# Patient Record
Sex: Male | Born: 1960 | Race: White | Hispanic: No | Smoking: Current every day smoker
Health system: Western US, Academic
[De-identification: ages and names within clinical notes are randomized; demographics above are authoritative.]

## PROBLEM LIST (undated history)

## (undated) DIAGNOSIS — E7800 Pure hypercholesterolemia, unspecified: Secondary | ICD-10-CM

## (undated) DIAGNOSIS — I10 Essential (primary) hypertension: Secondary | ICD-10-CM

## (undated) DIAGNOSIS — I6529 Occlusion and stenosis of unspecified carotid artery: Secondary | ICD-10-CM

## (undated) DIAGNOSIS — M5430 Sciatica, unspecified side: Secondary | ICD-10-CM

## (undated) DIAGNOSIS — M1990 Unspecified osteoarthritis, unspecified site: Secondary | ICD-10-CM

## (undated) DIAGNOSIS — R42 Dizziness and giddiness: Secondary | ICD-10-CM

## (undated) HISTORY — PX: OTHER PROCEDURE: U1053

## (undated) MED ORDER — NITROGLYCERIN 0.4 MG SL SUBL
0.40 mg | SUBLINGUAL_TABLET | SUBLINGUAL | Status: AC | PRN
Start: 2012-12-07 — End: ?

## (undated) MED ORDER — OXYCODONE-ACETAMINOPHEN 5-325 MG OR TABS
1.00 | ORAL_TABLET | Freq: Four times a day (QID) | ORAL | Status: AC | PRN
Start: 2012-12-30 — End: ?

---
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
## 2008-10-28 IMAGING — CT CT PELVIS W/O CM
1 of 2 series · 14 of 32 positions shown, 18 images · non-contrast
Comparison: None

CT ABDOMEN

CLINICAL DATA: Left flank pain, left lower quadrant pain,
hematuria and nausea and vomiting.

CT ABDOMEN AND PELVIS WITHOUT CONTRAST
TECHNIQUE: Multidetector CT imaging of the abdomen and pelvis was
performed following the standard
protocol without intravenous contrast.

[Series 2: 220 stone 5.0 b40f st · axial · 0.81mm/px · z∈[-356,+29]mm · 14 of 88 slices shown, 18 images]
[im 7/88  soft-tissue]
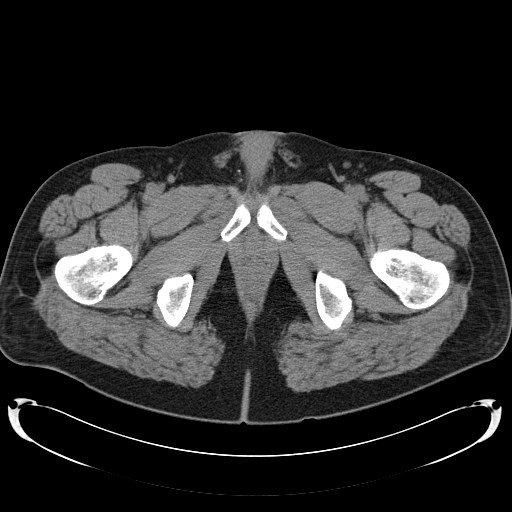
[im 7/88  bone]
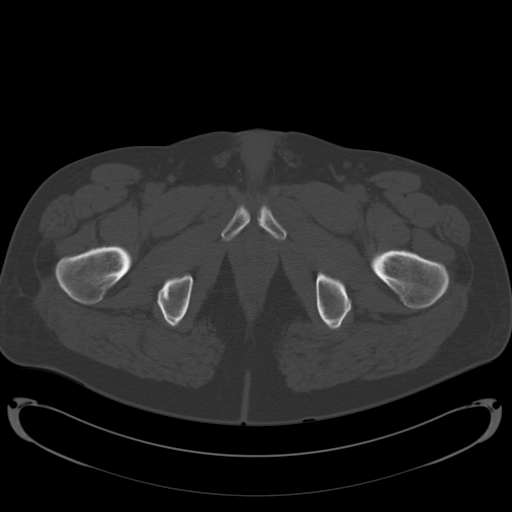
[im 13/88  soft-tissue]
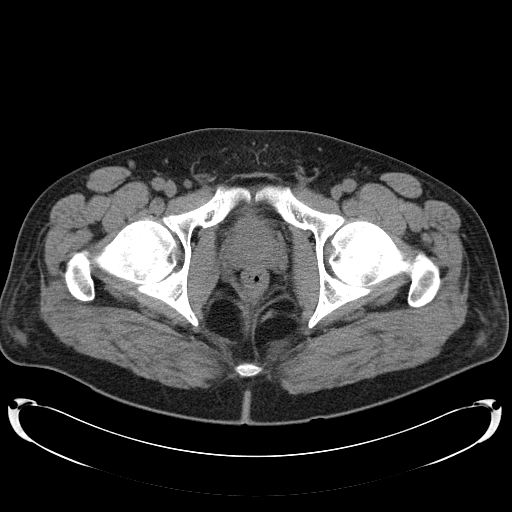
[im 19/88  soft-tissue]
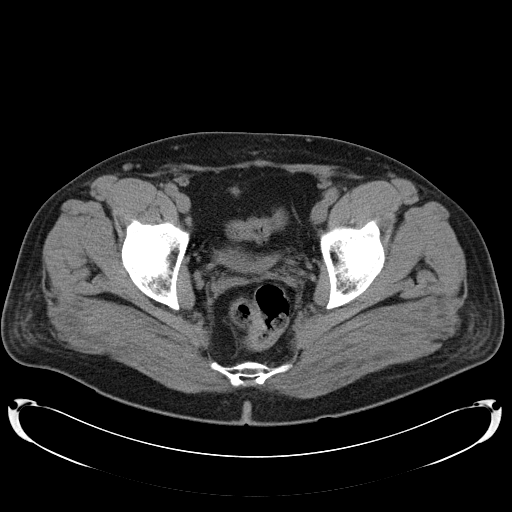
[im 25/88  soft-tissue]
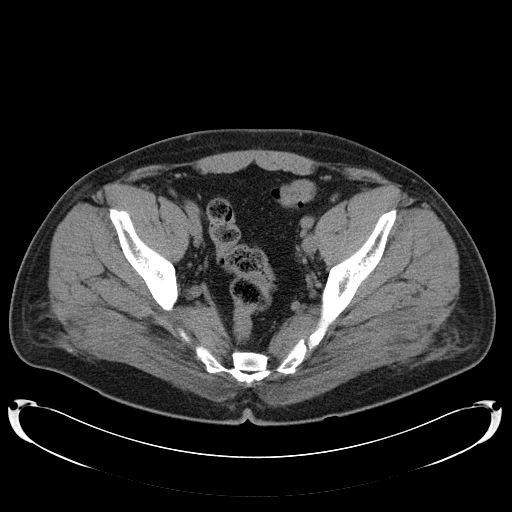
[im 35/88  soft-tissue]
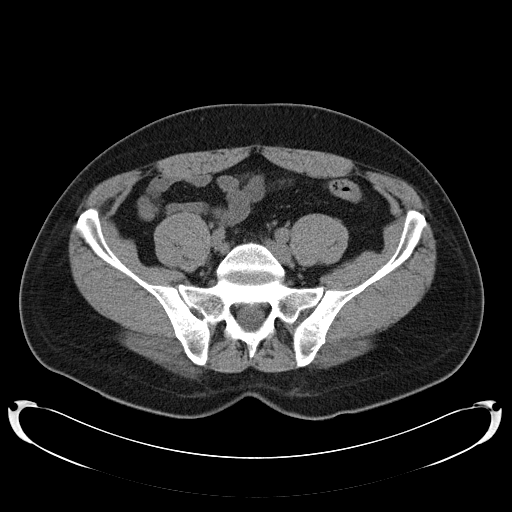
[im 41/88  soft-tissue]
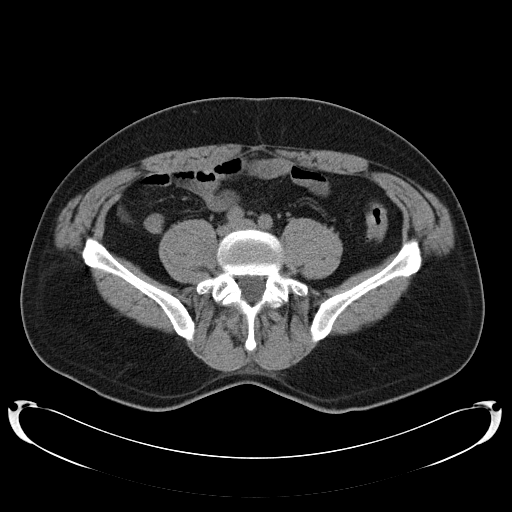
[im 47/88  soft-tissue]
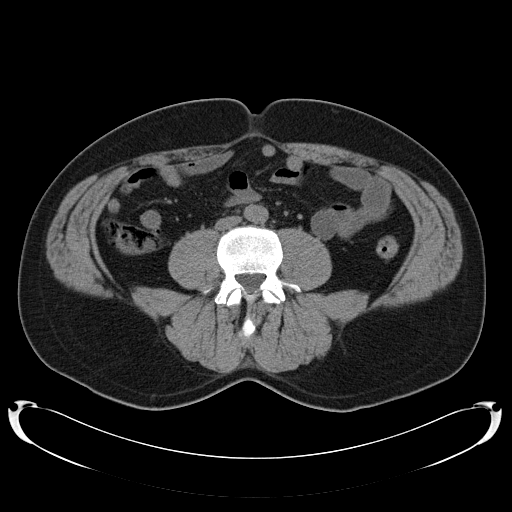
[im 53/88  soft-tissue]
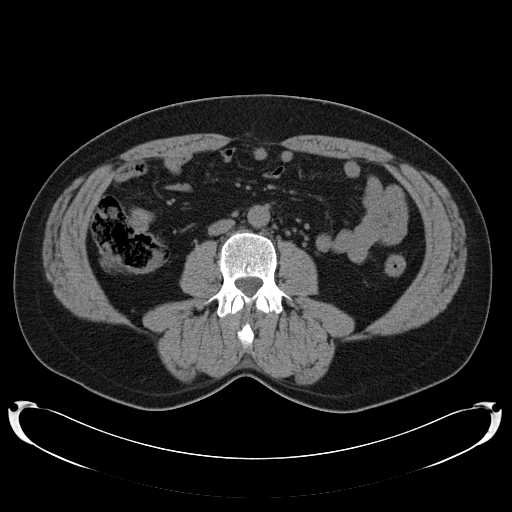
[im 63/88  soft-tissue]
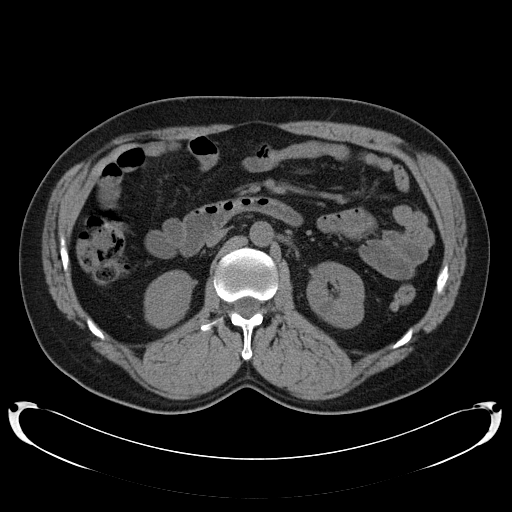
[im 63/88  bone]
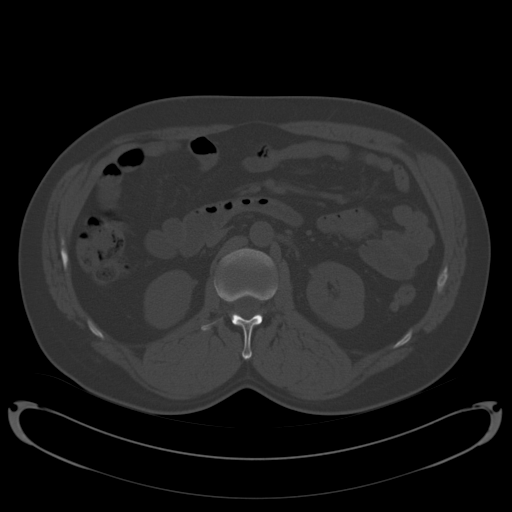
[im 69/88  soft-tissue]
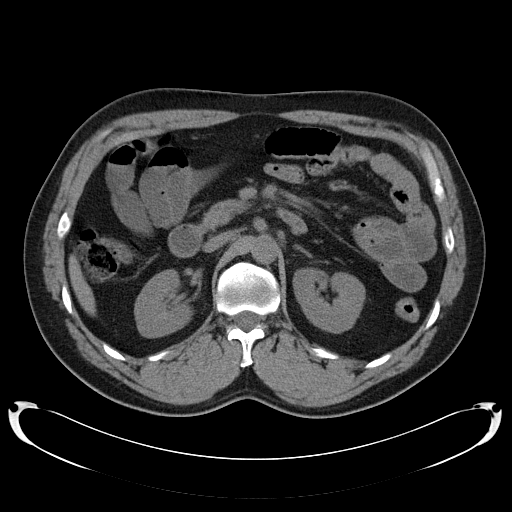
[im 75/88  soft-tissue]
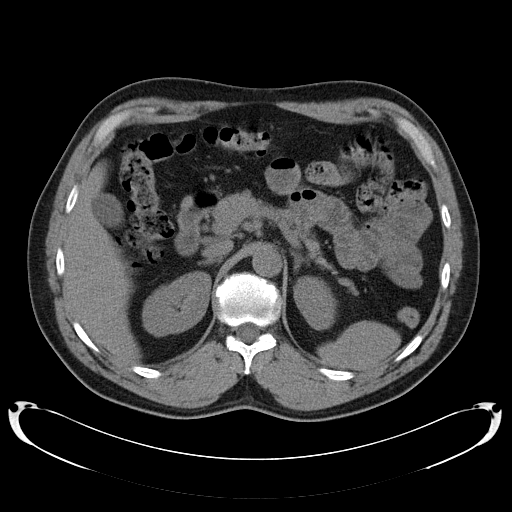
[im 75/88  lung]
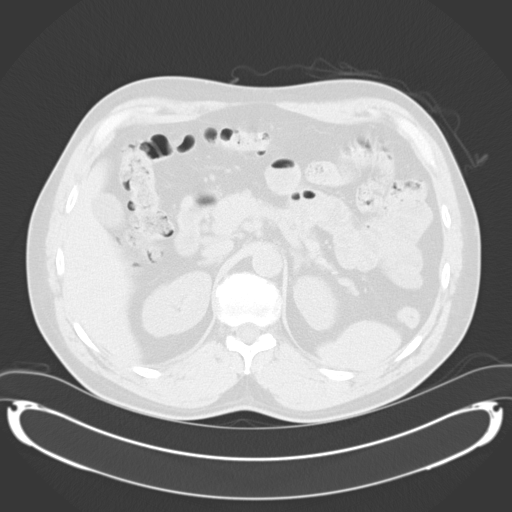
[im 78/88  lung]
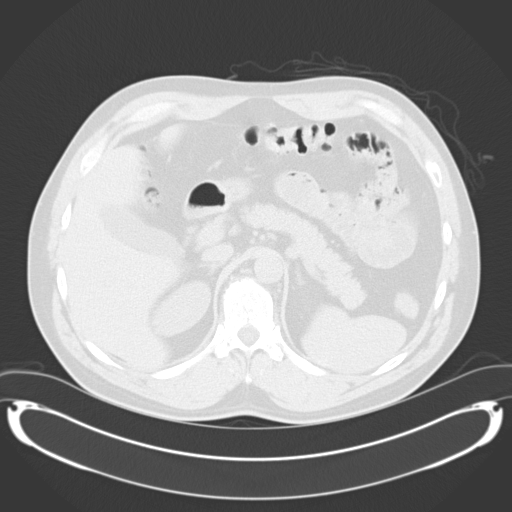
[im 81/88  soft-tissue]
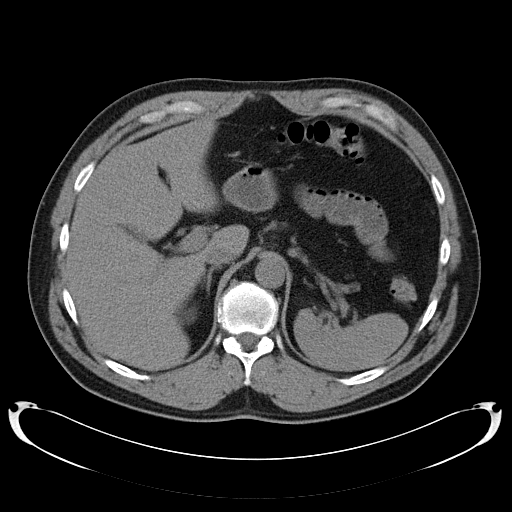
[im 81/88  lung]
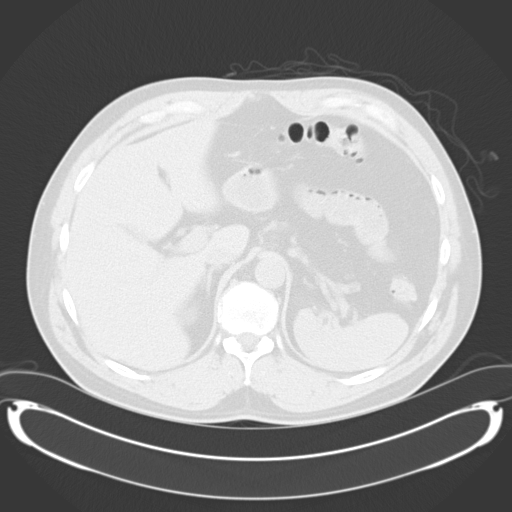
[im 84/88  lung]
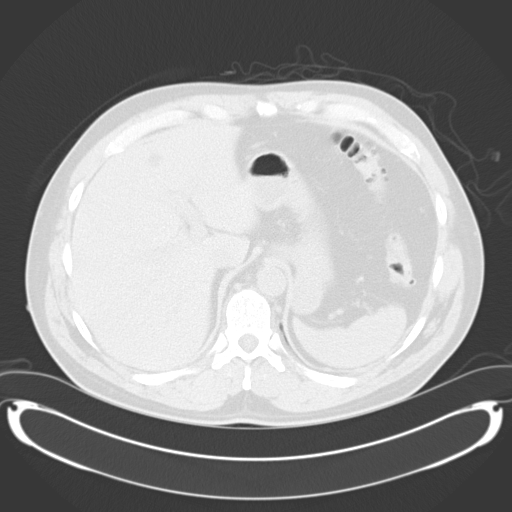

[14 of 32 positions shown; findings below may reference images not displayed]

FINDINGS: The visualized unenhanced appearance of the liver is
unremarkable.  There is a single low attenuation lesion in the
medial segment of the left hepatic lobe which measures 3.8 HU and
is consistent with benign hepatic cyst.

The spleen is normal in size.  The pancreas is unremarkable.  The
adrenal glands and kidneys demonstrate no abnormalities.
Specifically, no findings for renal calculi or renal obstructive
process.

The aorta is normal in caliber.  No mesenteric or retroperitoneal
masses or adenopathy.  The stomach, duodenum, small bowel and colon
demonstrate no significant findings.  The examination is somewhat
limited without oral contrast.

The gallbladder appears normal.  The appendix is visualized and is
normal.  There are a few small scattered colonic diverticuli.  No
significant bony findings.
IMPRESSION: 1.  No acute abdominal findings, mass lesions or adenopathy.  No
renal calculi or renal obstructive process.
2.  Simple-appearing hepatic cyst and left renal cyst.
3.  Normal appendix

CT PELVIS
FINDINGS: The rectum, sigmoid colon visualized small bowel loops
are unremarkable.  There are a few small scattered colonic
diverticuli.  No evidence for acute diverticulitis.  The bladder is
normal.  No distal ureteral or bladder calculi.  No pelvic masses
or adenopathy.  No free pelvic fluid collections.  There is a focal
area of inflammation in the mesentery adjacent to the upper sigmoid
colon.  This is most likely epiploic appendagitis.  This is usually
due to infarction of the epiploic  appendage and can be a source of
self-limiting abdominal pain..  No significant bony findings.
There are several small rounded sclerotic lesions which are likely
benign bone islands.  No inguinal masses or adenopathy.
IMPRESSION: 1.  Findings consistent with epiploic appendagitis.  Please see
above discussion.
2.  Scattered colonic diverticuli but no evidence for
diverticulitis.
3.  No masses or adenopathy.
4.  No ureteral or bladder calculi.

## 2012-12-07 ENCOUNTER — Emergency Department (HOSPITAL_COMMUNITY): Payer: Self-pay

## 2012-12-07 ENCOUNTER — Emergency Department
Admission: EM | Admit: 2012-12-07 | Discharge: 2012-12-08 | Disposition: A | Discharge status: Home / Self Care | Payer: MEDICAID | Attending: Emergency Medicine | Admitting: Emergency Medicine

## 2012-12-07 DIAGNOSIS — F17200 Nicotine dependence, unspecified, uncomplicated: Secondary | ICD-10-CM | POA: Insufficient documentation

## 2012-12-07 DIAGNOSIS — R110 Nausea: Secondary | ICD-10-CM | POA: Insufficient documentation

## 2012-12-07 DIAGNOSIS — G8929 Other chronic pain: Secondary | ICD-10-CM | POA: Insufficient documentation

## 2012-12-07 DIAGNOSIS — M545 Low back pain, unspecified: Secondary | ICD-10-CM | POA: Insufficient documentation

## 2012-12-07 DIAGNOSIS — G4700 Insomnia, unspecified: Secondary | ICD-10-CM

## 2012-12-07 DIAGNOSIS — R0789 Other chest pain: Secondary | ICD-10-CM | POA: Insufficient documentation

## 2012-12-07 DIAGNOSIS — Z5900 Homelessness unspecified: Secondary | ICD-10-CM | POA: Insufficient documentation

## 2012-12-07 MED ORDER — HYDROMORPHONE HCL 1 MG/ML IJ SOLN
1.0000 mg | Freq: Once | INTRAMUSCULAR | Status: AC
Start: 2012-12-07 — End: 2012-12-07
  Administered 2012-12-07: 1 mg via INTRAVENOUS
  Filled 2012-12-07: qty 1

## 2012-12-07 MED ORDER — NITROGLYCERIN 0.4 MG SL SUBL
0.4000 mg | SUBLINGUAL_TABLET | SUBLINGUAL | Status: DC | PRN
Start: 2012-12-07 — End: 2012-12-08
  Administered 2012-12-07: 0.4 mg via SUBLINGUAL
  Filled 2012-12-07: qty 25
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
## 2012-12-07 NOTE — ED Notes (Signed)
Report received from Carmelina Dane. RN.  First contact with patient.  Pt returned from xray.  Requesting pain medication for chest pain and back pain.  Pt on cardiac monitor. No ectopy noted on monitor NSR HR 63.

## 2012-12-07 NOTE — ED Notes (Signed)
ekg done and t/o to md xu Copy placed into chart

## 2012-12-07 NOTE — ED Notes (Signed)
Dr. Karie Chimera aware of pt's back and chest pain.  Order to medicate with NTG SL only.

## 2012-12-07 NOTE — ED Attending Note (Signed)
ED ATTENDING NOTE:    Date of encounter: 12/07/2012    Seen and examined with Dr. Karie Chimera. Please see house officer's note for full H&P. Key elements of history:    Chief complaint: Chest Pain      HISTORY: This is a 52 year old male, a [employer] patient usually, a smoker with a history of borderline HTN and hypertriglyceridemia but no diabetes, with a family history of father and mother who had MIs and stroke in their 102's, who presents with a complaint of episodes of gradual onset of dull, pressure-like "squeezing" pain located in his left chest, radiating to the left neck and head, with tingling down the left arm but nowhere else, that started yesterday as he was waling, getting more frequent and lasting longer. It has been intermittent lasting about 15 to 20 minutes at a time. The patient states it is associated with nausea, and maybe a little shortness of breath (he is not sure) but denies any associated  diaphoresis, significant shortness of breath, cough, lightheadedness, palpitations, or any other symptom.  It is unchanged with exertion, torso movements, deep breaths, leaning forward, leaning backward, food intake, and the patient denies any other exacerbating factors. It is relieved by nothing the patient can note. The pain was moderate at worst, and is now milder. The pain is reminiscent of nothing that he has had in the past. The patient specifically denies new ankle edema, calf pain, deep vein thrombosis or pulmonary embolus risk factors, but he does add that 1) he has been under some stress lately (with his living situation and with his care at the [place]), and 2) the pains seem to come on after he helped someone right their motorized wheelchair 2 days ago (but with no acute strain). Apart from the above, the ROS is otherwise unremarkable.    Key elements of exam:  Vitals noted:  ED Triage Vitals   Enc Vitals Group      Blood pressure (BP) 12/07/12 1736 128/72 mmHg      Heart Rate 12/07/12 1736 74      Respirations  12/07/12 1736 18      Temperature 12/07/12 1736 97.2 F (36.2 C)      Temp src --       SpO2 12/07/12 1736 97 %      Weight - scale 12/07/12 1736 220 lb (99.791 kg)     Subsequent vitals  BP 111/68  Pulse 61  Temp(Src) 97.3 F (36.3 C)  Resp 18  Wt 99.791 kg (220 lb)  SpO2 97%  Gen: NAD, WNWD, well-appearing, non-toxic   HEENT: face is without asymmetry; no conjunctival injection; no scleral icterus.  Neck: supple; there is no lymphadenopathy; no thyromegaly  Cardiovascular: Regular rate; regular rhythm. There are no murmurs. Can appreciate no rubs or gallops. There is no discernible JVD. Radial pulses are of equal strength bilaterally, and 2+.  Respiratory: lungs are clear to auscultation, with no tachypnea; the patient has no retractions or accessory muscle use.  Chest wall: there is no tenderness to palpation that exactly reproduces the patients presenting pain complaint, but when he extends his arms completely forward he states he feels a pulling discomfort in his chest.  Abdomen: soft; non-tender with no rebound or peritoneal signs; non-distended; no organomegaly; no masses; no hernias; no pulsatile masses.   Extremities: There is no significant edema or cyanosis.   Neuro: awake, alert, and oriented x 3, with fluent and non-dysarthric speech, following commands well; the patient is  moving all four extremities well with no gross ataxia, and there are no obvious focal neurological deficits evident.      IMPRESSION: I discussed the case presentation and plan with Dr. Karie Chimera, and agreed with the assessment and plan as discussed. With regards to the patient's chest pain, the history and physical is consistent with a differential diagnosis that includes GERD-related discomfort, chest wall pain, non-specific chest pain, or even atypical presentation of myocardial ischemia (as the patient does have a number of risk factors),with little in the clinical presentation to strongly suggest myocardial ischemia, aortic  dissection or other similar vasculopathy, pulmonary embolus, spontaneous pneumothorax, or other pathology.      MEDICAL DECISION MAKING / PLAN:   Will work up the differential diagnosis, initiate treatment as per orders, and reassess.         Pulse oximetry was checked, and was 97% on room air, which is normal.    The patient's EKG was reviewed: the rhythm was normal sinus and regular; the rate was normal at 73; the axis was normal; intervals were normal in duration; there were no acutely pathological ST-[place]ave abnormalities.      Will send a set of cardiac enzymes now and again at a time-point corresponding to 6 hours or more after the onset of symptoms. If the patient's symptoms were due to myocardial ischemia, there should be a corresponding abnormal elevation in troponin at this time point. If a set of cardiac enzymes (including a cardiac troponin) drawn at least 6 hours after the onset of symptoms reveals no abnormal elevations, this will make myocardial ischemia extremely unlikely as an etiology of the symptoms, especially in the context of the somewhat atypical presentation and overall risk profile.     Plan to get a stress ECHO from the ED assuming his enzymes are negative (Dr. Karie Chimera discussed case and risk stratification with Cardiology fellow on call, Dr. Theora Gianotti)

## 2012-12-07 NOTE — ED Notes (Signed)
Assumed care of pt from Karla,RN  Pt medicated per [facility] for pain, on cardiac monitoring, nsr 79, alarms on/audible, laying in bed low/locked, side rails upx2/call light in reach

## 2012-12-07 NOTE — ED Notes (Signed)
Pt self presents to ED with intermittent chest pain since yesterday with increase in duration today.  Also c/o back pain after lifting heavy object.  Pt on cardiac monitor no ectopy on monitor noted.  ECG, IV and Labs done by PRN nurse Carmelina Dane.

## 2012-12-07 NOTE — ED Notes (Signed)
Pt states pain not relieved by NTG.  Dr. Karie Chimera aware.  Discussing plan of care with Dr. Romona Curls.

## 2012-12-08 LAB — ECG 12-LEAD
QRS INTERVAL/DURATION: 92 ms
QRS INTERVAL/DURATION: 98 ms
VENTRICULAR RATE: 73 {beats}/min
VENTRICULAR RATE: 58 {beats}/min

## 2012-12-08 LAB — EXERCISE ECHO WITH IMAGE ENHANCEMENT AGENT IF NECESSARY

## 2012-12-08 MED ORDER — HYDROMORPHONE HCL 1 MG/ML IJ SOLN
1.0000 mg | Freq: Once | INTRAMUSCULAR | Status: AC
Start: 2012-12-08 — End: 2012-12-08
  Administered 2012-12-08: 1 mg via INTRAVENOUS
  Filled 2012-12-08: qty 1

## 2012-12-08 MED ORDER — HYDROMORPHONE HCL 1 MG/ML IJ SOLN
0.5000 mg | Freq: Once | INTRAMUSCULAR | Status: AC
Start: 2012-12-08 — End: 2012-12-08
  Administered 2012-12-08: 0.5 mg via INTRAVENOUS
  Filled 2012-12-08: qty 0.5

## 2012-12-08 MED ORDER — HALOPERIDOL 2 MG OR TABS
2.0000 mg | ORAL_TABLET | Freq: Every evening | ORAL | Status: DC
Start: 2012-12-08 — End: 2013-03-04

## 2012-12-08 MED ORDER — TRAZODONE HCL 150 MG OR TABS
150.0000 mg | ORAL_TABLET | Freq: Every evening | ORAL | Status: DC | PRN
Start: 2012-12-08 — End: 2013-03-04

## 2012-12-08 MED ORDER — OXYCODONE-ACETAMINOPHEN 5-325 MG OR TABS
1.0000 | ORAL_TABLET | Freq: Once | ORAL | Status: AC
Start: 2012-12-08 — End: 2012-12-08
  Administered 2012-12-08: 1 via ORAL
  Filled 2012-12-08: qty 1

## 2012-12-08 NOTE — ED Notes (Addendum)
Patient returned from [facility] via [place]; to new bed T1 and [place], RN informed.

## 2012-12-08 NOTE — ED Notes (Signed)
Pt still in Heart Station.

## 2012-12-08 NOTE — ED Notes (Signed)
Chart and info has been faxed to facilities per ED [facility]  Await yes or no on beds for ED placement   Pt aox3   Back pain 4/10 after percocet.  Per ED [facility].  She is to call back at 1845 to see [facility] can take pt.  Contact at [facility] is Carmela.

## 2012-12-08 NOTE — ED Notes (Signed)
Dr. Abran Duke to bedside for reevaluation of pt.

## 2012-12-08 NOTE — ED Notes (Signed)
Signout:  41M with h/o HLD, HTN, cigarette smoking presenting with L-precordial intermittent chest pain at rest radiating to arm.  EKG was normal.  Troponin negative x 1 (2nd set at 5am).  Plan for Stress ECHO in AM (Cardiology Dr. Theora Gianotti was made aware for functional study plan).    Myrtice Lauth, MD  12/08/12 [phone]

## 2012-12-08 NOTE — ED Notes (Signed)
Repeat troponin drawn and sent to lab. Pt medicated for pain. NAD. NO SOB. NSR.

## 2012-12-08 NOTE — ED Notes (Signed)
52 yr old male had stress test negative for cpain then states suicidal psych has seen is going to have dispo per case manager    Lorretta Harp, Margretta Ditty, MD  12/08/12 [phone]

## 2012-12-08 NOTE — ED Notes (Signed)
Pt moved to ER 11b. Sitter placed with pt. Pt calm/cooperative.

## 2012-12-08 NOTE — ED Notes (Signed)
Patient replaced on full monitor.

## 2012-12-08 NOTE — ED Notes (Signed)
Patient signed out to me from Dr Karie Chimera.  45M with chest pain.   Negative EKG, negative trops x 2.   Has been waiting on stress test this morning.  Received a dobutamine stress echo.  Findings were negative for ischemia.  Will DC with f/u with PMD and cardiology.      Viona Gilmore, MD  Resident  12/08/12 [phone]

## 2012-12-08 NOTE — ED Notes (Signed)
[facility] [facility] [place] [facility] with voucher, with meds in sealed bag, in nad, aao x4, rr even and unlabored, skins wdp, sts CP has resolved at this time, was given verbal and written instructions for f/u care with cardiology for Chest Pain, was explained possible worsening s/s to be aware of and was instructed to return to ed prior to appointment date if he feels his condition is not improving or is worsening, verbalized understanding and left ed ambulatory with steady gait, unassisted

## 2012-12-08 NOTE — ED Notes (Signed)
Assumed care of patient. Cardiac markers x 2 negative. Pt still c/o back pain. aaox4

## 2012-12-08 NOTE — ED Notes (Signed)
Pt now endorsing thoughts of si, requesting to speak to the md. md aware and will come speak to the patient

## 2012-12-08 NOTE — ED EKG Interpretation (Signed)
Repeat EKG 7:04am:   Sinus @ 58bpm, normal axis, PR interval , QRS duration 98ms, QTc=480ms; No ST elevations/depressions or TWI

## 2012-12-08 NOTE — ED Notes (Signed)
Chest pain for stress test in AM.    - Just going up to heart station now  - will f/u results  - will consult cardiology as needed    Viona Gilmore, MD  Resident  12/08/12 [phone]

## 2012-12-08 NOTE — ED Notes (Addendum)
Assumed care  Chest pain  ECG normal  Trop neg x 2  CXR normal  Discussed with Cards (Dr. Theora Gianotti)  Pending stress test later this morning    Glen Fowler, Glen Sjogren, MD  12/08/12 0704    Stress test negative  Stable for DC home    Glen Fowler, Glen Sjogren, MD  12/08/12 1321    Upon DC patient endorsed SI with plan  Psych consulted    Glen Fowler, Glen Sjogren, MD  12/08/12 1551

## 2012-12-08 NOTE — ED Notes (Signed)
Repeat EKG done and handed to MD Sherryll Burger

## 2012-12-08 NOTE — Consults (Signed)
Psychiatry Consult Note    Date of Admission: 12/07/2012  Consulting Attending: Sherol Dade, MD  Reason for Consult: suicide risk assessment     History of Present Illness:     Glen Fowler is a 52 year old male with a self reported history of bipolar was BIB self to ED with complaints of chest pain and arm numbness concerning for MI and expressed SI in the context of being told of discharge, recent homelessness, Utox pending.      Patient initially presented with chest pain and arm numbness now with troponins negative x2, EKG, stress echo wnl. Medically cleared.  Per ED staff, when told of impending discharge, patient expressed SI with plan to go to friends that have guns and kill himself.      Patient states that he does not feel safe by himself and that he has multiple friends with weapons that he has access to. Patient recently lost his VA benefits 4 days ago and was kicked out of his [facility] ([facility]). He has been on the streets since and rented a hotel room 2 nights ago with the last of his money. Yesterday morning he was feeling anxious, hopeless from his social situation and chest pain so he brought himself to the ED. Patient originally moved here from [facility] a few months ago as he was promised a job but he arrived too late to [place]. Since then he has been working side jobs and using up his savings. He recently got VA benefits but lost them 4 days ago. He claims that the DD214 form was "screwed up" because "they got my dates wrong".     Patient reports a history of bipolar diagnosed a few years ago. Reports manic phases consistent with staying up few days straight and going out to spend money frequently. Patient's father killed himself in 2010 which lead the patient to attempt suicide himself by jumping off a cliff resulting in BL femur fractures. Patient has been on abilify (unknown dose) and Klonopin for bipolar and trazadone 150 prn for insomnia; however, he stopped taking his medications 6 months  ago as they were too expensive and he wasn't sure if they were helping him. Patient also reports hearing voices since yesterday. He says he knows the voices are not real and that he's never heard voices before and it makes him feel unstable.     Patient is quite future oriented, specifically looking forward to obtaining VA benefits and willing to be placed in a homeless shelter despite feeling unsafe with himself. He is looking forward to follow up with outpatient psychiatry for further management and hopes to get back on medications.     Past Psychiatric History:     1) Diagnoses: bipolar per self report  2) Suicide attempts: yes, 2010 jumped off a cliff and fractured BL femurs   3) Inpatient Hospitalizations: yes, patient reports inpatient psych admission in past  4) Outpatient treatment/psychiatrist: prior treatment >6 mo ago    5) Medication Trials: zoloft, seroquel, depakote, haldol    Substance History:  denies    Medical History:   No past medical history on file.    Allergies:   Allergies   Allergen Reactions   . Aspirin Unspecified   . Compazine Unspecified   . Toradol Unspecified       Medications:   Current Facility-Administered Medications   Medication   . [COMPLETED] HYDROmorphone (DILAUDID) injection 0.5 mg   . [COMPLETED] HYDROmorphone (DILAUDID) injection 1 mg   . [  COMPLETED] HYDROmorphone (DILAUDID) injection 1 mg   . [COMPLETED] HYDROmorphone (DILAUDID) injection 1 mg   . nitroGLYcerin (NITROSTAT) SL tablet 0.4 mg     No current outpatient prescriptions on file.       Scheduled:  . [COMPLETED] HYDROmorphone  0.5 mg Once   . [COMPLETED] HYDROmorphone  1 mg Once   . [COMPLETED] HYDROmorphone  1 mg Once   . [COMPLETED] HYDROmorphone  1 mg Once       PRN:  . nitroGLYcerin  0.4 mg Q5 Min PRN       Social History:  Currently homeless as of 4 days ago.     Family History:   Father suicide  Mother "sick" in [place]                 Mental Status Exam:   Appearance: Lying in ED bed with unkempt hair and  beard, smells horrible.  Behavior: no abnormal movements  Cognition: AOx3  Speech: normal rate, tone, volume, articulation  Mood: "I feel unstable"  Affect: full   Thought Process: linear  Thought Content: Denies HI. Endorses SI without plan and is future oriented as he wishes to go to shelter and f/u outpatient. Endorses "hearing voices" that he knows are not real  Insight/Judgment: limited/limited    Pertinent Studies/Labs:   Negative troponins x2, normal EKG, stress ECHO, CBC, BMP    Assessment:    Glen Fowler is a 51 year old male with a self reported history of bipolar was BIB self to ED with complaints of chest pain and arm numbness concerning for MI and expressed SI in the context of being told of discharge, recent homelessness, Utox pending.    Patient's SI appears contingent upon housing and VA benefits.  His expression of SI in the face of impending discharge also speaks to his desire for housing.   He is quite future oriented and is looking forward to obtaining VA benfits, a shelter and psychiatric follow up, he at low risk for suicide at this time.     A comprehensive suicide risk assessment was performed and the patient was assessed to be at a low acute risk of self-harm.      Modifiable risk factors include suicidality (manifested by suicidal ideation), precipitating stressors and access to firearms or other lethal means.  Non-modifiable risk factors include previous suicide attempts, male gender, single status and family history of completed suicide.  The patient also has protective factors of future life plans.      IMPRESSION  Bipolar (self report)    Recommendations:  1.  Patient does not meet criteria for psychiatric hospitalization nor 5150 at this time. He is at low acute risk for self harm  2.  Med recs at this time include 150mg  Trazadone Qhs PRN for sleep and haldol 2mg  PO qhs for mood stabilization.  3.  Patient provided with information for clinics for outpatient followup.   4.  Patient  submitted for [facility] and case management will continue to attempt to arrange shelter housing  5  Thank you for this consult.  Please page [phone] with any questions.      Case discussed and staffed with attending, Dr. Bland Span  Note written in collaboration with Gaynelle Arabian, MS3

## 2012-12-08 NOTE — ED Notes (Addendum)
Report received from [facility], pt await stress test for chest pain. Cardiac markers neg x2. Pt resting at this time, on full monitor in NAD. 2L O2 via nc. Will cont to monitor.

## 2012-12-08 NOTE — ED Notes (Signed)
Stress Echo Interpretation:    REPORT Location: [facility] IP Accession #: [id number]  Requesting Physician: Julianne Rice MD ([id number])    Indication: 52 year old male with Chest pain.  Risk factors: age, family history, hypertension, hyperlipidemia and  tobacco use  Cardiac history: None  Medications: beta blocker    Baseline Findings  Baseline BP:114/57 HR:50 Rhythm:Sinus    Dobutamine Findings  The patient received up to 30 mcg/kg/min of dobutamine.  In addition the patient received 0.4 mg of atropine.  The maximum heart rate achieved was 143 bpm, which is 85% of predicted.  Dobutamine was stopped for target heart rate.  There was no chest pain during dobutamine infusion.    ECG Findings  Baseline ECG: Normal with no evidence of significant ST or T wave  abnormalities  Dobutamine ECG: There were no ECG changes with dobutamine infusion.    Echo Findings  Baseline Echo: No regional wall motion abnormalities are identified.  Dobutamine Echo: There was normal augmentation of all segments with  dobutamine.    Conclusions  1) Negative for ischemia.  2) Normal heart rate response to dobutamine.  3) Normal blood pressure response to dobutamine.      Signed by Christell Faith, M.D. on 12/08/2012 12:26:59 PM        Viona Gilmore, MD  Resident  12/08/12 1254

## 2012-12-08 NOTE — ED Notes (Signed)
Pt back from heart station

## 2012-12-08 NOTE — Discharge Instructions (Signed)
Chest Pain of Unclear Etiology    You have been seen for chest pain. The cause of your pain is not yet known.    Your doctor has learned about your medical history, examined you, and checked any tests that were done. Still, it is unclear why you are having pain. The doctor thinks there is only a very small chance that your pain is caused by a life-threatening condition. Later, your primary care doctor might do more tests or check you again.    Sometimes chest pain is caused by a dangerous condition, like a heart attack, aorta injury, blood clot in the lung, or collapsed lung. It is unlikely that your pain is caused by a life-threatening condition if: Your chest pain lasts only a few seconds at a time; you are not short of breath, nauseated (sick to your stomach), sweaty, or lightheaded; your pain gets worse when you twist or bend; your pain improves with exercise or hard work.    Chest pain is serious. It is VERY IMPORTANT that you follow up with your regular doctor and seek medical attention immediately here or at the nearest Emergency Department if your symptoms become worse or they change.    YOU SHOULD SEEK MEDICAL ATTENTION IMMEDIATELY, EITHER HERE OR AT THE NEAREST EMERGENCY DEPARTMENT, IF ANY OF THE FOLLOWING OCCURS:   Your pain gets worse.   Your pain makes you short of breath, nauseated, or sweaty.   Your pain gets worse when you walk, go up stairs, or exert yourself.   You feel weak, lightheaded, or faint.   It hurts to breathe.   Your leg swells.   Your symptoms get worse or you have new symptoms or concerns.        General Observation Stay (Obs)    You had an extended stay under observation.    Observation Units can be in the Emergency Department (ED). They can also be in other parts of a hospital. You may get care from the ED or other medical services. Observation units are sometimes called "Obs Units." They are also called Extended Stay Units or Clinical Decision Units (CDU). They also  have many other names. Observation care is usually for less than 24 hours.    Observation care can be a very important part of your hospital visit. While in an ED, the doctors can do tests and take care of most of your problems. This usually happens over a short stay. A longer stay is very useful for some problems.   Some medical problems take a while to treat. For example, high blood sugar may need 12-15 hours to correct. Dehydration may need a few hours of intravenous (IV) fluids. There are many other problems that may need longer care.   Some medical problems need advanced tests. An example is chest pain. With chest pain, the doctor may want to do stress testing. He or she may also want an echo (ultrasound) of your heart. A CT scan or MRI of your heart arteries could also be useful. These tests check if your chest pain is caused by a serious heart problem.   Some medical problems take time to show up. A short stay in an ED is like having a snap shot picture of your medical problem. It only shows things over a short time. An observation stay is more like a movie: Doctors can watch your medical problem over time.     Medical problems can be very complex and some conditions can be   very difficult to figure out, even with advanced medical testing. It is important to understand that even after an observation stay, reexaminations, and repeat testing, you may still have a serious medical problem.    After your observation stay, the doctor thinks it is safe for you to home. You will get more instructions about the problem you came in for.    Come to the Emergency Department again if your symptoms get worse or you think you still have a serious medical problem.

## 2012-12-08 NOTE — ED Notes (Signed)
mealtray ordered for pt

## 2012-12-08 NOTE — ED Notes (Signed)
52 yo M presented with cp originally, stress test neg  -then endorsed SI  -psych assessed, cleared  -community placement pending    Zoe Lan, MD  Resident  12/08/12 [phone]

## 2012-12-08 NOTE — ED Notes (Signed)
Pt back from heart station, pt is Aox4, requesting water. Pt c/o cp and back pain after stress test, Dr. Abran Duke notified. Report to [facility], who is assuming the care of this pt at this time. Moved to tx bed 7B

## 2012-12-08 NOTE — ED Notes (Signed)
Pt seen by Dr. Abran Duke. Pt went outside to smoke, Dr. Abran Duke okay with this. Pt will be moved to another room upon return.

## 2012-12-08 NOTE — ED Notes (Signed)
Assumed care of pt. Pt asking for pain medication. Pt alert and oriented x 4. Standing at side of bed using a urinal. NAD. VSS. NSR.

## 2012-12-08 NOTE — ED Notes (Signed)
Pt on level 2.   Sitter at bedside  Medically cleared,   Await Psych MD

## 2012-12-08 NOTE — ED Notes (Signed)
Pt requesting food, per MD Lovetro, pt ok to eat prior to midnight. Given meal tray at this time

## 2012-12-08 NOTE — ED Notes (Signed)
Pt currently in heart station for Treadmill stress echo.

## 2012-12-08 NOTE — ED Notes (Signed)
Pt sleeping in bed, NAD, awakens to verbal stimuli, denies needs at this time

## 2012-12-08 NOTE — ED Notes (Signed)
Pt. Changed into hospital gown, 2 bags of belongings taken to locker.  Sitter at bedside.

## 2012-12-15 NOTE — ED Follow-up Note (Signed)
Follow-up type: Callback       Routine ED Patient Call Back    Patient unable to be contacted, no message left

## 2012-12-16 ENCOUNTER — Emergency Department
Admission: EM | Admit: 2012-12-16 | Discharge: 2012-12-16 | Disposition: A | Discharge status: Home / Self Care | Payer: MEDICAID | Attending: Emergency Medicine | Admitting: Emergency Medicine

## 2012-12-16 ENCOUNTER — Encounter (HOSPITAL_BASED_OUTPATIENT_CLINIC_OR_DEPARTMENT_OTHER): Payer: Self-pay

## 2012-12-16 ENCOUNTER — Emergency Department (HOSPITAL_BASED_OUTPATIENT_CLINIC_OR_DEPARTMENT_OTHER): Payer: Self-pay

## 2012-12-16 DIAGNOSIS — M545 Low back pain, unspecified: Secondary | ICD-10-CM | POA: Insufficient documentation

## 2012-12-16 DIAGNOSIS — Y998 Other external cause status: Secondary | ICD-10-CM | POA: Insufficient documentation

## 2012-12-16 DIAGNOSIS — Z043 Encounter for examination and observation following other accident: Secondary | ICD-10-CM

## 2012-12-16 DIAGNOSIS — W11XXXA Fall on and from ladder, initial encounter: Secondary | ICD-10-CM

## 2012-12-16 DIAGNOSIS — M79609 Pain in unspecified limb: Secondary | ICD-10-CM | POA: Insufficient documentation

## 2012-12-16 DIAGNOSIS — S8990XA Unspecified injury of unspecified lower leg, initial encounter: Secondary | ICD-10-CM | POA: Insufficient documentation

## 2012-12-16 DIAGNOSIS — W19XXXA Unspecified fall, initial encounter: Secondary | ICD-10-CM

## 2012-12-16 DIAGNOSIS — E7800 Pure hypercholesterolemia, unspecified: Secondary | ICD-10-CM | POA: Insufficient documentation

## 2012-12-16 DIAGNOSIS — I10 Essential (primary) hypertension: Secondary | ICD-10-CM | POA: Insufficient documentation

## 2012-12-16 DIAGNOSIS — M542 Cervicalgia: Secondary | ICD-10-CM | POA: Insufficient documentation

## 2012-12-16 DIAGNOSIS — S99929A Unspecified injury of unspecified foot, initial encounter: Secondary | ICD-10-CM | POA: Insufficient documentation

## 2012-12-16 DIAGNOSIS — Y9289 Other specified places as the place of occurrence of the external cause: Secondary | ICD-10-CM | POA: Insufficient documentation

## 2012-12-16 DIAGNOSIS — S0990XA Unspecified injury of head, initial encounter: Secondary | ICD-10-CM | POA: Insufficient documentation

## 2012-12-16 DIAGNOSIS — T07XXXA Unspecified multiple injuries, initial encounter: Secondary | ICD-10-CM | POA: Insufficient documentation

## 2012-12-16 DIAGNOSIS — R071 Chest pain on breathing: Secondary | ICD-10-CM | POA: Insufficient documentation

## 2012-12-16 HISTORY — DX: Essential (primary) hypertension: I10

## 2012-12-16 HISTORY — DX: Pure hypercholesterolemia, unspecified: E78.00

## 2012-12-16 MED ORDER — HYDROMORPHONE HCL 1 MG/ML IJ SOLN
1.00 mg | Freq: Once | INTRAMUSCULAR | Status: AC
Start: 2012-12-16 — End: 2012-12-16
  Administered 2012-12-16: 1 mg via INTRAVENOUS
  Filled 2012-12-16: qty 1

## 2012-12-16 MED ORDER — IBUPROFEN 600 MG OR TABS
600.00 mg | ORAL_TABLET | Freq: Three times a day (TID) | ORAL | Status: AC | PRN
Start: 2012-12-16 — End: ?

## 2012-12-16 MED ORDER — HYDROMORPHONE HCL 1 MG/ML IJ SOLN
1.00 mg | Freq: Once | INTRAMUSCULAR | Status: AC
Start: 2012-12-16 — End: 2012-12-16

## 2012-12-16 MED ORDER — SODIUM CHLORIDE 0.9 % IV SOLN
Freq: Once | INTRAVENOUS | Status: AC
Start: 2012-12-16 — End: 2012-12-16

## 2012-12-16 MED ORDER — HYDROMORPHONE HCL 1 MG/ML IJ SOLN
INTRAMUSCULAR | Status: AC
Start: 2012-12-16 — End: 2012-12-16
  Administered 2012-12-16: 1 mg via INTRAVENOUS
  Filled 2012-12-16: qty 1

## 2012-12-16 MED ORDER — HYDROCODONE-ACETAMINOPHEN 5-325 MG OR TABS
1.0000 | ORAL_TABLET | Freq: Four times a day (QID) | ORAL | Status: DC | PRN
Start: 2012-12-16 — End: 2012-12-19

## 2012-12-16 NOTE — Discharge Instructions (Signed)
Fall, No Apparent Injury     You were seen for a fall.     There are many reasons why people fall. It is important to make sure you didn t get hurt from the fall. It is also important to make sure that you didn’t fall for another medical reason.     Sometimes people fall by simply tripping over something or slipping. This is called a “mechanical fall.” Sometimes, it s just from a simple accident. Sometimes, a person will think he/she just tripped, but the fall was really caused by a medical problem. This is true especially in the elderly. Some things that often cause falls are low blood sugar, fainting or near fainting. The reason could also be a balance problem, weakness, stroke, seizure or another serious problem. You may need more tests if the doctor thinks a more serious problem caused your fall. These could be blood tests, EKGs, x-rays, CT scans or MRIs.     You don’t seem to have any serious injuries. It is safe for you to go home today without more testing.     Follow up with your doctor.      You should make small changes to your home to keep from falling. You could add a tub mat to keep you from slipping. You can also fix loose carpeting and rugs so you won t trip.     YOU SHOULD SEEK MEDICAL ATTENTION IMMEDIATELY, EITHER HERE OR AT THE NEAREST EMERGENCY DEPARTMENT, IF ANY OF THE FOLLOWING OCCUR:  · You cannot speak clearly (slurring), one side of your face droops or you feel weak in the arms or legs (especially on one side).  · You "pass out.”  · You have severe headache, dizziness or problems with balance.  · You have severe pain.  · You cannot walk.  · You fall and hit your head.  · You have other concerns.        Fall Prevention (Edu)     You have requested information on Fall Prevention.     According to a 2003 study from the Journal of the American Geriatric[employer] Society, more than 1.8 million adults, aged 65 and older, were treated in emergency departments for fall-related injuries. More than 421,000  were hospitalized. The most common injuries from a fall are head injuries that in turn cause a brain injury, and fractures (broken bones). Of all types of broken bones that happen from falls, hip fractures are the most serious and lead to the greatest number of health problems and deaths.     To make your living area safer, older adults should consider the following:  · Improve lighting throughout the home. Use night-lights to help you see at night.  · Have handrails installed on both sides of stairways.  · Have grab bars placed next to the toilet and in the shower. Also consider an elevated toilet seat and a shower chair.  · Use non-slip bath mats in the tub or shower.  · Remove "throw rugs" to prevent tripping.  · Avoid the use of long robes to prevent tripping.  · Wear well-fitted shoes or slippers. Wearing loose footwear can cause you to shuffle, and make you more likely to trip and fall. Inexpensive anti-slip socks can also be purchased.  · Be sure to keep all electrical cords and small objects out of the pathway.  · If a cane, walker or any other assistive device is used, be sure to have them inspected regularly. The devices must be used correctly to prevent injuries.  ·   Remember to move about at a pace that is comfortable for your ability. For example, do not rush to answer the doorbell or the phone. Take your time.     In recent studies, a number of risk factors have been identified that make older adults more likely to have falls. It has also been shown that when these risk factors are modified, it will help to prevent falls.  · Exercise: Regular physical activity or exercise increases body strength and improves balance.  · Medication Review: Follow up with your doctor and pharmacist as needed to review your medications and any recent changes that may have been made. They can tell you if there are drug interactions and side-effects. If you are taking any sedatives or sleeping pills, it might be possible to  have the dosage decreased, or have the number of medications reduced. These kinds of medications can cause drowsiness and dizziness, thus posing a risk of falling.  · Vision Checks: Follow up with an eye doctor at least once a year to have your vision checked.

## 2012-12-16 NOTE — ED Notes (Signed)
Pt to xray via gurney

## 2012-12-16 NOTE — ED Notes (Addendum)
c-collar removed by dr Abran Duke.

## 2012-12-16 NOTE — ED Provider Notes (Signed)
Patient seen promptly; note entry delayed.     Glen Fowler  MRN: [id number]  DOB: August 01, 1961  Primary: No Pcp, Per Patient  Triage CC:   Chief Complaint   Patient presents with   . Trauma     fell off 10 feet ladder, now with back/hip/flank pain, no loc stated pt alert following commands      HPI: 52 year old male comes in after a fall from a ladder from >30ft or equal to. Was working on roof. Slipped, caught leg in ladder and slid down to ground. Denies LOC. No HA. + Cspine tenderness. Placed immediately in c collar at onset of my encounter with him. + midline T and L spine ttp. No abdominal pain. Full recall. Some leg pain as well. No other extremity or chest wall pain. No SOB. No CP.      Triage vitals:   ED Triage Vitals   Enc Vitals Group      Blood pressure (BP) 12/16/12 1252 109/77 mmHg      Heart Rate 12/16/12 1252 101      Respirations 12/16/12 1252 14      Temp --       Temp src --       SpO2 12/16/12 1252 98 %      Weight - scale 12/16/12 1252 223 lb (101.152 kg)      Height 12/16/12 1252 6' (1.829 m)      Head Cir --       Peak Flow --       Pain Score --       Pain Loc --       Pain Edu? --       Excl. in GC? --      Vitals record:   4    12/16/12  1252   BP: 109/77   Pulse: 101   Resp: 14   SpO2: 98%     PMH: No past medical history on file.  PSH: No past surgical history on file.  FH: No family history on file.  Allergies: Aspirin; Compazine; and Toradol              Patient's Medications   New Prescriptions    No medications on file   Previous Medications    HALOPERIDOL (HALDOL) 2 MG TABLET    Take 1 tablet by mouth at bedtime.    TRAZODONE (DESYREL) 150 MG TABLET    Take 1 tablet by mouth nightly as needed for Insomnia.   Modified Medications    No medications on file   Discontinued Medications    No medications on file     Medications   sodium chloride 0.9 % 1,000 mL IV bolus (not administered)   HYDROmorphone (DILAUDID) injection 1 mg (not administered)     Results for orders placed during the  hospital encounter of 12/07/12   BASIC METABOLIC PANEL, BLOOD       Result Value Range    Glucose 87  70 - 115 mg/dL    BUN 12  6 - 20 mg/dL    Creatinine 2.95  2.84 - 1.17 mg/dL    GFR >13      Sodium 139  136 - 145 mmol/L    Potassium 3.7  3.5 - 5.1 mmol/L    Chloride 102  98 - 107 mmol/L    Bicarbonate 25  22 - 29 mmol/L    Calcium 8.9  8.6 - 10.0 mg/dL   MAGNESIUM, BLOOD  Result Value Range    Magnesium 2.4  1.7 - 2.6 mg/dL   PHOSPHORUS, BLOOD       Result Value Range    Phosphorous 3.4  2.7 - 4.5 mg/dL   CPK-CREATINE PHOSPHOKINASE, BLOOD       Result Value Range    CPK 114  0 - 175 U/L   CKMB+INDEX (NO CPK) PANEL, BLOOD       Result Value Range    CK-MB 2.2  0.0 - 4.8 ng/mL    CK-MB Index 1.9  0.0 - 2.5 %   TROPONIN T, BLOOD       Result Value Range    Troponin T <0.01  <0.01 ng/mL   CBC WITH ADIFF, BLOOD       Result Value Range    WBC 6.3  4.0 - 10.0 1000/mm3    RBC 4.79  4.60 - 6.10 mill/mm3    Hgb 15.0  13.7 - 17.5 gm/dL    Hct 16.1  09.6 - 04.5 %    MCV 87.5  79.0 - 95.0 um3    MCH 31.3  26.0 - 32.0 pgm    MCHC 35.8  32.0 - 36.0 %    RDW 12.5  12.0 - 14.0 %    MPV 11.1  9.4 - 12.4 fL    Plt Count 205  140 - 370 1000/mm3    Segs 57  34 - 71 %    Lymphocytes 32  19 - 53 %    Monocytes 8  5 - 12 %    Eosinophils 3  1 - 7 %    Basophils 1  0 - 2 %    Absolute Neutrophil Count 3.6  1.6 - 7.0 1000/mm3    Abs Lymphs 2.0  0.8 - 3.1 1000/mm3    Abs Monos 0.5  0.2 - 0.8 1000/mm3    Abs Eosinophils 0.2  0.0 - 0.5 1000/mm3    Diff Type Automated     PROTHROMBIN TIME, BLOOD       Result Value Range    PT,Patient 11.4  9.7 - 12.5 sec    INR 1.1     APTT, BLOOD       Result Value Range    PTT 31.7  25.0 - 34.0 sec   CKMB+INDEX (NO CPK) PANEL, BLOOD       Result Value Range    CK-MB 1.8  0.0 - 4.8 ng/mL    CK-MB Index 1.9  0.0 - 2.5 %   TROPONIN T, BLOOD       Result Value Range    Troponin T <0.01  <0.01 ng/mL   CPK-CREATINE PHOSPHOKINASE, BLOOD       Result Value Range    CPK 96  0 - 175 U/L   ECG 12-LEAD        Result Value Range    VENTRICULAR RATE 73      ATRIAL RATE 73      PR INTERVAL 146      QRS INTERVAL/DURATION 92      QT 380      QTC INTERVAL 418      P AXIS 56      R AXIS 4      T AXIS 41      ECG INTERPRETATION        Value: Normal sinus rhythm      Normal ECG            Confirmed by Above generated  by computer only, Results in ED notes (204),       editor HADNOT, ANTHONY ([id number]) on 12/08/2012 9:06:35 AM   ECG 12-LEAD       Result Value Range    VENTRICULAR RATE 58      ATRIAL RATE 58      PR INTERVAL 154      QRS INTERVAL/DURATION 98      QT 440      QTC INTERVAL 431      P AXIS 67      R AXIS 21      T AXIS 57      ECG INTERPRETATION        Value: Sinus bradycardia      Otherwise normal ECG            Confirmed by Above generated by computer only, Results in ED notes (204),       editor HADNOT, ANTHONY ([id number]) on 12/08/2012 9:06:40 AM   TREADMILL STRESS ECHO       Result Value Range    REPORT        Value: Location: [facility]     Accession #:  [id number]      Requesting Physician: Julianne Rice MD ([id number])            Indication: 51 year old male with Chest pain.      Risk factors:  age, family history, hypertension, hyperlipidemia and      tobacco use      Cardiac history: None      Medications: beta blocker            Baseline Findings      Baseline BP:114/57   HR:50  Rhythm:Sinus            Dobutamine Findings      The patient received up to 30 mcg/kg/min of dobutamine.      In addition the patient received 0.4 mg of atropine.      The maximum heart rate achieved was 143 bpm, which is 85% of predicted.      Dobutamine was stopped for target heart rate.      There was no chest pain during dobutamine infusion.            ECG Findings      Baseline ECG: Normal with no evidence of significant ST or T wave      abnormalities      Dobutamine ECG: There were no ECG changes with dobutamine infusion.            Echo Findings      Baseline Echo: No regional wall motion abnormalities are identified.      Dobutamine Echo: There  was normal augmentation of all segments with      dobutamine.            Conclusions      1) Negative for ischemia.      2) Normal heart rate response to dobutamine.      3) Normal blood pressure response to dobutamine.                  Signed by Christell Faith, M.D. on 12/08/2012 12:26:59 PM      Electronic signature derived from a controlled access password.     No results found for this visit on 12/16/12.  X-ray Chest Frontal And Lateral    12/07/2012   EXAM DESCRIPTION: CHEST 2 VIEWS FRONTAL & LATERAL  CLINICAL HISTORY: Chest pain.  COMPARISON: None available.  FINDINGS: No pleural effusion or pneumothorax demonstrated.  No consolidation.  Unremarkable cardiomediastinal silhouette.  No acute osseous abnormality identified.  IMPRESSION: Unremarkable two-view chest.  CONCURRENT SUPERVISION: I have reviewed the images and agree with the Fellow's interpretation.     REVIEW OF SYSTEMS: 12 point ROS performed with patient, with pertinent positives and negatives as per HPI.  Constitutional: no fever, chills or weight loss   Eyes: no vision changes   HENT: no headaches, nose bleeds or oral lesions   Cardiovascular: no chest pain or peripheral edema   Respiratory: no SOB, dyspnea or hemoptysis   Gastrointestinal: no nausea, vomiting, or hematemesis. No abdominal distention   Genitourinary: no dysuria   Musculoskeletal: Neck pain, back pain, leg pain  Integumentary: no rashes or jaundice   Neurological: no tremors or difficulties with memory   Psychiatric: Negative   Endocrine: no cold/heat intolerance, no excessive sweating or polyuria   Hematologic/Lymphatic: no easy bruising or bleeding   Allergic/Immunologic: negative, see allergy list  All other systems were reviewed and are negative in ROS - except as noted above and/or presented in HPI.    PHYSICAL EXAM:  Gen: NAD, AOx3. Appears well-developed and well-nourished. Denies HA, or photophobia, or neck stiffness.  + cervical and thoracic and lumbar pain.   HEENT: Throat: OP  NL, no exudates, Dentition good. No lesions seen in proximal OP or on uvula. No uvular deviation.   Nose: normal, no dried blood, no septal hematoma, not TTP.   EAC L/R NL, TM L NL, TM R NL.   No facial dysjunction. No infraorbital anesthesia or trismus. No stepoffs or malocclusion.   No peritonsillar swelling or uvular deviation. No tongue fasciculations.  Eyes: PERRL OU, 4--2, 4--2, No APD OU. EOMI OU.   No greater than physiologic horizontal nystagmus OU. No vertical or rotatory   component OU. No subjective diplopia. Non-icteric. CVF grossly full OU.   CV: Rate: NL. No Rubs, Murmurs or Gallups. Cap refill <2 sec. No JVD. Strong distal pulses and symmetric, UE and LE BL. Patient with no difference in sensation sitting forward or backward.   Resp: CTABL, Symmetric BS. No stridor. No Rales. No Rhonchi. No Wheezing.  Abd: NT/ND, No G/R, no masses. No SP tenderness. No CVA tenderness either side. Normal bowel sounds. No borborygmi.   MSK: Pos straight leg raise R leg. TTP BL LE, knees, tibfib.   Skin: No Rash,  Ecchy,  Lesions,  Wounds,  Cellulitis, or Abscess. No jaundice.  Neuro: CN 2-12 Grossly intact. MAEx4.   No dysdiadokinesia, NL finger-nose, No dysmetria, No pastpointing. No pronator drift. Gait Normal, Romberg Neg, Babinski Downgoing. No asterixis. Sensation intact to LP/DP/PP RUE, RLE, LUE, LLE - and symmetric. Speech is fluid and coherent. No dysphonia, or hypophonia, or apparent aphasia - receptive or expressive. Correct object naming. EOMI OU and pursuit fluid OU, non-saccadic. See EYES for remainder of exam.  Psych: Appropriate    RESULTS:   O2 sat interpretation: Normal levels on RA; Normal.    Labs: as resulted.  Monitor: No ectopy seen unless otherwise noted. Normal.  Radiology: as resulted.    ASSESSMENT/PLAN    52 year old male with complaint of Trauma    1. Fall  - patient with fall > or equal to 10 ft  - medium risk as per [place] rules  - with neck pain  - with some t and L midline  - i placed  in collar at onset of my  exam  - will ct head as well, as above  - no LOC  - CT cspine  - will run T and L as well  - positive R leg raise  - labs as ordered  - no abdominal pain  - remainder of xrays as ordered    Case was discussed with the attending of record, who concurs with the assessment and plan unless otherwise noted in his/her note. Tori Milks, MD - R3 / PGY5  Resident, Emergency Medicine          Viona Gilmore, MD  Resident  12/16/12 [phone]

## 2012-12-16 NOTE — ED Attending Note (Signed)
ED ATTENDING NOTE:    Patient interviewed and examined by me. Case was discussed with resident, Dr.  Abran Duke.    Chief Complaint   Patient presents with   . Trauma     fell off 10 feet ladder, now with back/hip/flank pain, no loc stated pt alert following commands        Case summary is as follows: The Patient is a 52 year old year old male who presents with Trauma  . The history is obtained from the patient. He reports having an a fall off attempting to climb down as the ladder from his roof. The patient reports he was on his way down to the ladder for his left foot went inside the ladder and then he fell approximately 6 feet and the opposite way to the ground. Were 1 foot was on one side of the ladder and the rest of his body was on the other side ladder. He states the foot that was still on the outside landed straight on the ground causing some heel pain. He then hit his head. He denies any loss of consciousness or headache. He does report having some cervical spinal tenderness. He also states he has a midline upper and lower back pain in addition to right knee pain.      Pain Assessment: The patient reports their pain as 6/10 at maximum and currently has 4/10.     Past Medical History   Diagnosis Date   . Hypertension    . Pure hypercholesterolemia      Past Surgical History   Procedure Laterality Date   . No past surgical history       History     Social History   . Marital Status: Single     Spouse Name: N/A     Number of Children: N/A   . Years of Education: N/A     Occupational History   . Not on file.     Social History Main Topics   . Smoking status: Current Every Day Smoker   . Smokeless tobacco: Not on file   . Alcohol Use: Not on file   . Drug Use: Not on file   . Sexually Active: Not on file     Other Topics Concern   . Not on file     Social History Narrative   . No narrative on file     Family History   Problem Relation Age of Onset   . Hypertension Mother    . Heart Mother    . Stroke Mother    .  Hypertension Father    . Heart Father    . Stroke Father      Medication Review Moment  Prior to Admission Medications   Outpatient Medications Last Dose Informant Patient Reported? Taking?   haloperidol (HALDOL) 2 MG tablet   No No   Sig: Take 1 tablet by mouth at bedtime.   traZODone (DESYREL) 150 MG tablet   No No   Sig: Take 1 tablet by mouth nightly as needed for Insomnia.      Facility-Administered Medications: None       Review of Systems - Constitutional: negative, fatigue, malaise, fever.  Eyes: negative, blurry vision, double vision.  Ears, Nose, Mouth, Throat: negative, sore throat, nasal congestion, ear pain.  CV: negative, chest pain.  Resp: negative, shortness of breath.  GI: negative, vomiting, nausea, abdominal pain.  GU: negative, dysuria, hematuria.  Musculoskeletal: joint pain, back pain, muscle pain,  neck pain.  Integumentary: bruising.  Neuro: negative, numbness or tingling.    Physical Examination:   4    12/16/12  1252   BP: 109/77   Pulse: 101   Resp: 14   SpO2: 98%     Vital Signs noted from Triage Page.  Adriaan Maltese is a well developed well nourished male in no apparent distress. He is alert and oriented *3.   HEENT exam shows: Ears TM clear bilaterally. Eyes PERRL EOMI. Mouth poor oral hygiene. Head atraumatic.  Neck exam is non-tender.   Chest exam is clear to auscultation bilaterally. Mild discomfort with chest wall palpation.  Cardiac exam shows regular rate and rhythm. No murmurs auscultated.   Abdominal exam has bowel sounds present and soft. The abdomen is non-tender. No masses or rebound can be palpated.  Back exam has no costovertebral angle tenderness. diffuse spinal tenderness to palpation.   Extremity exam is significant for 2/2 pulses. Tenderness to palpation at the right knee and ankle. No obvious ecchymosis or bruising.    Diagnostic Studies:  Pulse oximetry on room air is 98%. Oxygen Saturation interpretation is normal on Room Air.  Lab Result:    Results for orders placed  during the hospital encounter of 12/16/12   CBC WITH ADIFF, BLOOD       Result Value Range    WBC 6.9  4.0 - 10.0 1000/mm3    RBC 5.14  4.60 - 6.10 mill/mm3    Hgb 15.7  13.7 - 17.5 gm/dL    Hct 16.1  09.6 - 04.5 %    MCV 87.4  79.0 - 95.0 um3    MCH 30.5  26.0 - 32.0 pgm    MCHC 35.0  32.0 - 36.0 %    RDW 12.4  12.0 - 14.0 %    MPV 10.7  9.4 - 12.4 fL    Plt Count 206  140 - 370 1000/mm3    Segs 69  34 - 71 %    Lymphocytes 23  19 - 53 %    Monocytes 6  5 - 12 %    Eosinophils 1  1 - 7 %    Absolute Neutrophil Count 4.7  1.6 - 7.0 1000/mm3    Abs Lymphs 1.6  0.8 - 3.1 1000/mm3    Abs Monos 0.4  0.2 - 0.8 1000/mm3    Abs Eosinophils 0.1  0.0 - 0.5 1000/mm3    Diff Type Automated     COMPREHENSIVE METABOLIC PANEL, BLOOD       Result Value Range    Glucose 90  70 - 115 mg/dL    BUN 16  6 - 20 mg/dL    Creatinine 4.09  8.11 - 1.17 mg/dL    GFR >91      Sodium 136  136 - 145 mmol/L    Potassium 4.2  3.5 - 5.1 mmol/L    Chloride 104  98 - 107 mmol/L    Bicarbonate 20 (*) 22 - 29 mmol/L    Calcium 9.2  8.6 - 10.0 mg/dL    Total Protein 7.5  6.0 - 8.0 g/dL    Albumin 4.4  3.5 - 5.2 g/dL    Bilirubin, Tot 0.3  <1.2 mg/dL    AST (SGOT) 27  0 - 40 U/L    ALT (SGPT) 29  0 - 41 U/L    Alkaline Phos 69  40 - 129 U/L   MAGNESIUM, BLOOD       Result  Value Range    Magnesium 1.9  1.7 - 2.6 mg/dL   APTT, BLOOD       Result Value Range    PTT 31.8  25.0 - 34.0 sec   PROTHROMBIN TIME, BLOOD       Result Value Range    PT,Patient 10.3  9.7 - 12.5 sec    INR 1.0      CBC, chemistries, liver function tests and coagulation studies are normal.  Radiology Results:   CT HEAD W/O CONTRAST (Preliminary result)  Result time: 12/16/12 16:30:27      Preliminary result by Electronic Interface To Epic, Ancillary System (12/16/12 16:30:27)      Narrative:      Francia Greaves DESCRIPTION:  CT HEAD/BRAIN W/O CONTRAST on 12/16/2012 at 1539 hours    CLINICAL HISTORY:  Fall, 10 foot from ladder.    TECHNIQUE:  A CT scan of the head was performed with 2.5 mm  contiguous axial slices from   the foramen magnum to the skull vertex without the use of intravenous   contrast.    RADIATION DOSE FOR ALL CONCURRENTLY PERFORMED EXAMS:    SERIES CTDIvol(mGy) DLP(mGy-cm)    2 58.69 958.24    2 20.10 464.08    COMPARISON:  None available.    FINDINGS:  The ventricles and sulci are normal for age. No mass-effect or midline shift   is present, and the basal cisterns are patent. There is no evidence of   intracranial hemorrhage or mass lesion.    The soft tissues of the scalp are normal. The calvarium and skull base are   normal without evidence of fracture or other abnormality. Mild mucosal   thickening of the right maxillary sinus. A small mucous retention cyst lies   within the left maxillary sinus. Otherwise, the remaining visualized paranasal  sinuses and mastoid air cells are clear.    IMPRESSION:  No evidence of acute intracranial hemorrhage, mass effect, midline shift or   fracture.    CONCURRENT SUPERVISION:  I have reviewed the images and agree with the Resident's interpretation.    DOSE STATEMENT:  [facility] CT scanners employ modern techniques for CT dose   reduction, including protocol review, automatic exposure control, and   iterative reconstruction techniques. These features assure that radiation   dose levels in CT are optimized and are consistent with state-of-the-art, low   dose CT practice.                  CT C-SPINE W/O CONTRAST (Final result)  Result time: 12/16/12 16:22:52      Final result by Electronic Interface To Epic, Ancillary System (12/16/12 16:22:52)      Narrative:      Francia Greaves DESCRIPTION:  CT CERVICAL W/O CONTRAST    CLINICAL HISTORY:  Trauma. Fall from 10 foot ladder.    TECHNIQUE:  Helical CT was obtained from the skull base through the upper chest without   contrast and reconstructed in axial, sagittal and coronal planes using soft   tissue and bone window algorithms.    CTDI (mGy)= 58.69. DLP(mGy-cm)= 858.24.    CTDI (mGy)= 20.10.  DLP(mGy-cm)= 464.08.    COMPARISON:  CT of the thoracic spine, same day.    FINDINGS:  There is no evidence for acute fracture or subluxation. The vertebral body   heights are maintained. There is normal bone mineralization.    C1-C2: Normal C1-C2 relationship. Moderate degenerative changes at the median   atlantoaxial articulation,  with adjacent ligamentous ossification superior and  inferior to the anterior arch of C1. No significant central canal narrowing.    C2-C3: Disc height preserved. No significant spinal canal or neural foraminal   narrowing.    C3-C4: Mild disc height loss. Associated uncinate process hypertrophy. Small   focal disc protrusion. Mild bilateral neural foraminal narrowing. Mild central  canal narrowing.    C4-C5: Moderate loss of intervertebral disc height. Associated uncinate   process hypertrophy and posterior disc osteophyte complex. 2 mm of   degenerative retrolisthesis of C4 on C5. Moderate to severe bilateral neural   foraminal narrowing. Mild central canal narrowing.    C5-C6: Mild to moderate loss of intervertebral disc height. Associated   uncinate process hypertrophy. Moderate to severe right neural foraminal   narrowing. Mild central canal narrowing.    C6-C7: Disc height preserved. No significant spinal canal or neural foraminal   narrowing.    C7-T1: Disc height preserved. No significant spinal canal or neural foraminal   narrowing.    There is no prevertebral soft tissue swelling. Visualized portions of the lung  apices are normal. Scattered atherosclerotic calcifications are noted.    IMPRESSION:  No acute fracture or malalignment of the cervical spine.    Mild to moderate degenerative disc disease of the mid cervical spine, with   associated central canal and neural foraminal narrowing, as described. Minimal  degenerative retrolisthesis of C4 on C5.                  CT L-SPINE W/O IV CONTRAST (Final result)  Result time: 12/16/12 16:22:17      Final result by Electronic  Interface To Epic, Ancillary System (12/16/12 16:22:17)      Narrative:        INDICATION: Trauma.    COMPARISON: None.    TECHNIQUE: Helical CT was obtained through the lumbar spine without contrast   and reconstructed in axial, sagittal and coronal planes using soft tissue and   bone window algorithms.    CTDI (mGy)= 29.46. DLP(mGy-cm)= 735.63.    FINDINGS:    There is no evidence for acute fracture or subluxation. Chronic bilateral L5   pars defects are noted, without spondylolisthesis. The vertebral body heights   and alignments are maintained. There is normal bone mineralization.    The disc heights are preserved. There is no significant central canal or   neural foraminal narrowing.    There are mild degenerative changes of both sacroiliac joints.    There is no paravertebral soft tissue swelling. There is minimal   atherosclerotic calcification of the abdominal aorta.    IMPRESSION:    No acute fracture or malalignment of the lumbar spine.    Chronic L5 pars defects without spondylolisthesis.                  CT T-SPINE W/O IV CONTRAST (Final result)  Result time: 12/16/12 16:21:45      Final result by Electronic Interface To Epic, Ancillary System (12/16/12 16:21:45)      Narrative:        INDICATION: Fall from height .    COMPARISON: None.    TECHNIQUE: Helical CT was obtained through the thoracic spine without contrast  and reconstructed in axial, sagittal and coronal planes using soft tissue and  bone window algorithms.    CTDI (mGy)= 30.17. DLP(mGy-cm)= 1098.22.    FINDINGS:    There are 12 rib-bearing thoracic vertebral segments.    There is no  evidence for acute fracture or subluxation. The vertebral body   heights and alignments are maintained. There is mild degenerative disc disease  of the mid to lower thoracic spine with associated disc height loss, endplate  osteophytes, and Schmorl's nodes. There is normal bone mineralization.    There is no significant spinal canal or neural foraminal  narrowing.    There is no paravertebral soft tissue swelling.    Visualized portions of the lungs demonstrate dependent atelectasis.    IMPRESSION:    No acute fracture or malalignment of the thoracic spine.    Mild degenerative disc disease of the mid to lower thoracic spine.                  X-RAY CHEST FRONTAL AND LATERAL (Final result)  Result time: 12/16/12 16:21:10      Final result by Electronic Interface To Epic, Ancillary System (12/16/12 16:21:10)      Narrative:      Francia Greaves DESCRIPTION:  CHEST 2 VIEWS FRONTAL & LATERAL    CLINICAL HISTORY:  Fall from ladder    COMPARISON:  12/07/2012    FINDINGS:  Moderately expanded lungs.    No pleural effusion or pneumothorax. No consolidation.    Unremarkable cardiomediastinal silhouette.    No acute osseous abnormality.    IMPRESSION:  No acute chest injury.    CONCURRENT SUPERVISION:  I have reviewed the images and agree with the resident interpretation.                  X-RAY CALCANEUS MINIMUM 2 VIEWS - LEFT (Final result)  Result time: 12/16/12 16:03:04      Final result by Electronic Interface To Epic, Ancillary System (12/16/12 16:03:04)      Narrative:        INDICATION: Fall.    COMPARISON: None.    TECHNIQUE: 2 image(s) of the left calcaneus.    FINDINGS: No acute fracture, malalignment or joint effusion is seen.   Enthesopathy is seen at the Achilles insertion on the calcaneus. Soft tissues   are unremarkable.    IMPRESSION:    No acute osseous abnormality.                  X-RAY CALCANEUS MINIMUM 2 VIEWS - RIGHT (Final result)  Result time: 12/16/12 16:03:04      Final result by Electronic Interface To Epic, Ancillary System (12/16/12 16:03:04)      Narrative:        INDICATION: Fall.    COMPARISON: None.    TECHNIQUE: 2 image(s) of the right calcaneus.    FINDINGS: No acute fracture, malalignment or joint effusion is seen.   Enthesopathy is seen at the Achilles insertion on the calcaneus. An os   peroneum is noted. Soft tissues are  unremarkable.    IMPRESSION:    No acute osseous abnormality.                  X-RAY TIBIA & FIBULA 2 VIEWS - LEFT (Final result)  Result time: 12/16/12 16:01:27      Final result by Electronic Interface To Epic, Ancillary System (12/16/12 16:01:27)      Narrative:        INDICATION: Fall from height .    COMPARISON: Left knee and calcaneus radiographs, same day.    TECHNIQUE: Four image(s) of the left tibia/fibula.    FINDINGS:    There is no evidence for fracture or dislocation. A corticated ossicle seen at  the medial malleolar tip, likely reflecting sequela of a remote avulsion   injury. There is no overlying soft tissue swelling. A round soft tissue   calcification is noted within the medial soft tissues at the mid tibia level   measuring approximately 6 mm, likely a phlebolith. The visualized joint spaces  are congruent.    As seen on left knee radiographs a linear density projects over the patellar   tendon on the lateral view .    IMPRESSION:    No acute osseous abnormality.    Linear density projecting over the patellar tendon on the lateral view.   Recommend correlation for penetrating trauma and foreign body.                  X-RAY TIBIA & FIBULA 2 VIEWS - RIGHT (Final result)  Result time: 12/16/12 15:59:53      Final result by Electronic Interface To Epic, Ancillary System (12/16/12 15:59:53)      Narrative:        INDICATION: Fall from height .    COMPARISON: Right knee and calcaneus radiographs, same day.    TECHNIQUE: Four image(s) of the right tibia/fibula.    FINDINGS:    There is no evidence for fracture or dislocation. The soft tissues are   normal. The visualized joint spaces are normal.    IMPRESSION:    No acute osseous abnormality.                  X-RAY KNEE 3 VIEWS - RIGHT (Final result)  Result time: 12/16/12 15:56:56      Final result by Electronic Interface To Epic, Ancillary System (12/16/12 15:56:56)      Narrative:        HISTORY: Fall from 10 feet.    COMPARISON STUDIES:  None.    TECHNIQUE: 3 image(s) of the right knee.    FINDINGS: There is no acute fracture, malalignment, or joint effusion. The   joint spaces are preserved. Small medial compartment marginal osteophytes are   noted. There is mild nonspecific prepatellar soft tissue edema.    IMPRESSION:    No acute osseous abnormalities.                  X-RAY KNEE 3 VIEWS - LEFT (Final result)  Result time: 12/16/12 15:55:29      Final result by Electronic Interface To Epic, Ancillary System (12/16/12 15:55:29)      Narrative:        HISTORY: Fall from 10 feet.    COMPARISON STUDIES: None.    TECHNIQUE: 3 image(s) of the left knee.    FINDINGS: There is no acute fracture, malalignment, or joint effusion. The   joint spaces are preserved. An approximately 12 mm in length linear density   projects over the patellar tendon on the lateral view, possibly dystrophic   calcification versus a foreign body. The soft tissues are otherwise normal.    IMPRESSION:    No acute osseous abnormalities.    Dystrophic calcification versus foreign body overlying the patellar tendon on   the lateral view. Please correlate with any signs of penetrating trauma to the  anterior knee.     Impression: The patient is a 52 year old male who presents after a fall off a ladder from a roof with multiple injury complaints including head, neck, back and lower extremities. The concern is for blunt head trauma 4 subdural hematoma or intracranial hemorrhage, as well as a cervical spinal injury such  fracture. His other chest wall complaints and extremity complaints concerning for rib fractures or other extremity fractures.    Differential Diagnosis: blunt head trauma, subdural hematoma or intracranial hemorrhage, as well as a cervical spinal injury such fracture, rib fractures or other extremity fractures.    Medical Decision Plan: Emergency room plan is to obtain x-rays of the extremities as well as her head and cervical spine as well as a thoracic and lumbar spine  CT looking for acute blunt trauma. Also bruised plan to obtain laboratory studies looking for anemia or other sources of blood loss. I I agree with the resident's history and physical exam presented. The resident has developed an appropriate plan and management for the patient.

## 2012-12-16 NOTE — ED Notes (Signed)
Pt back from ct and xray, pt c/o mod back pain after being moved for scans.

## 2012-12-16 NOTE — ED Notes (Signed)
Dr Abran Duke at bedside for eval, placed in hard collar +mae, alert following commands

## 2012-12-19 ENCOUNTER — Emergency Department
Admission: EM | Admit: 2012-12-19 | Discharge: 2012-12-20 | Disposition: A | Discharge status: Home / Self Care | Payer: MEDICAID | Attending: Emergency Medicine | Admitting: Emergency Medicine

## 2012-12-19 DIAGNOSIS — Z87442 Personal history of urinary calculi: Secondary | ICD-10-CM | POA: Insufficient documentation

## 2012-12-19 DIAGNOSIS — N281 Cyst of kidney, acquired: Secondary | ICD-10-CM

## 2012-12-19 DIAGNOSIS — F17200 Nicotine dependence, unspecified, uncomplicated: Secondary | ICD-10-CM | POA: Insufficient documentation

## 2012-12-19 DIAGNOSIS — R109 Unspecified abdominal pain: Secondary | ICD-10-CM | POA: Insufficient documentation

## 2012-12-19 DIAGNOSIS — E7800 Pure hypercholesterolemia, unspecified: Secondary | ICD-10-CM | POA: Insufficient documentation

## 2012-12-19 DIAGNOSIS — I10 Essential (primary) hypertension: Secondary | ICD-10-CM | POA: Insufficient documentation

## 2012-12-19 DIAGNOSIS — R319 Hematuria, unspecified: Secondary | ICD-10-CM | POA: Insufficient documentation

## 2012-12-19 DIAGNOSIS — N23 Unspecified renal colic: Secondary | ICD-10-CM

## 2012-12-19 LAB — URINALYSIS
Nitrite: NEGATIVE
pH: 6.5 (ref 5.0–8.0)

## 2012-12-19 MED ORDER — HYDROMORPHONE HCL 1 MG/ML IJ SOLN
0.50 mg | Freq: Once | INTRAMUSCULAR | Status: AC
Start: 2012-12-19 — End: 2012-12-19
  Administered 2012-12-19: 0.5 mg via INTRAVENOUS
  Filled 2012-12-19: qty 0.5

## 2012-12-19 MED ORDER — TAMSULOSIN HCL 0.4 MG PO CAPS
0.40 mg | ORAL_CAPSULE | Freq: Once | ORAL | Status: AC
Start: 2012-12-19 — End: 2012-12-19
  Administered 2012-12-19: 0.4 mg via ORAL
  Filled 2012-12-19: qty 1

## 2012-12-19 MED ORDER — HYDROMORPHONE HCL 1 MG/ML IJ SOLN
1.00 mg | Freq: Once | INTRAMUSCULAR | Status: AC
Start: 2012-12-19 — End: 2012-12-19
  Administered 2012-12-19: 1 mg via INTRAVENOUS
  Filled 2012-12-19: qty 1

## 2012-12-19 MED ORDER — ONDANSETRON HCL 4 MG/2ML IV SOLN
4.00 mg | Freq: Once | INTRAMUSCULAR | Status: AC
Start: 2012-12-19 — End: 2012-12-19
  Administered 2012-12-19: 4 mg via INTRAVENOUS
  Filled 2012-12-19: qty 2

## 2012-12-19 MED ORDER — HYDROCODONE-ACETAMINOPHEN 5-325 MG OR TABS
1.0000 | ORAL_TABLET | Freq: Four times a day (QID) | ORAL | Status: DC | PRN
Start: 2012-12-19 — End: 2012-12-20

## 2012-12-19 MED ORDER — SODIUM CHLORIDE 0.9 % IV SOLN
Freq: Once | INTRAVENOUS | Status: AC
Start: 2012-12-19 — End: 2012-12-19

## 2012-12-19 NOTE — ED Notes (Signed)
To xray via gurney accompanied by xray transporter.

## 2012-12-19 NOTE — ED Notes (Signed)
To US via gurney accompanied by ED Tech.

## 2012-12-19 NOTE — ED Attending Note (Signed)
Seen and d/w dr Rolm Baptise  agree w/ap as discussed    CC: L flank pain    52 year old male with known h/o nephrolithiasis, OA, "enlarged heart" here with one day of L flank pain radiating to groin. Started in his back and pain has been moving down to his groin. Also with hematuria. Pain is sharp, some nausea, now pain is migrating. Pt states symptoms are typical of when he's had stones before. Had lithotripsy for one and passed two on his own. ROS otherwise neg.     Past Medical History   Diagnosis Date   . Hypertension    . Pure hypercholesterolemia      Surgical history: umbilical hernia repair    Current Facility-Administered Medications   Medication   . [COMPLETED] HYDROmorphone (DILAUDID) injection 0.5 mg   . [COMPLETED] HYDROmorphone (DILAUDID) injection 1 mg   . [COMPLETED] ondansetron (ZOFRAN) injection 4 mg   . [COMPLETED] sodium chloride 0.9 % 500 mL IV bolus   . [START ON 12/20/2012] tamsulosin (FLOMAX) capsule 0.4 mg     Current Outpatient Prescriptions   Medication Sig   . haloperidol (HALDOL) 2 MG tablet Take 1 tablet by mouth at bedtime.   Marland Kitchen HYDROcodone-acetaminophen (NORCO) 5-325 MG per tablet Take 1 tablet by mouth every 6 hours as needed for Moderate Pain (Pain Score 4-6).   Marland Kitchen ibuprofen (MOTRIN) 600 MG tablet Take 1 tablet by mouth every 8 hours as needed for Mild Pain (Pain Score 1-3).   . traZODone (DESYREL) 150 MG tablet Take 1 tablet by mouth nightly as needed for Insomnia.     History   Substance Use Topics   . Smoking status: Current Every Day Smoker   . Smokeless tobacco: Not on file   . Alcohol Use: Not on file     Family History   Problem Relation Age of Onset   . Hypertension Mother    . Heart Mother    . Stroke Mother    . Hypertension Father    . Heart Father    . Stroke Father      ROS- aside from above reviewed items, all systems reviewed and are negative.    Exam  BP 139/84  Pulse 98  Temp(Src) 97.1 F (36.2 C)  Resp 16  Wt 104.327 kg (230 lb)  BMI 31.19 kg/m2  SpO2 96%  Vital  signs interpretation: mild htn  Head: uncomfortable appearing male   Neck: not stiff  CV: rrr -m  Lungs: cta  ABD: soft, nontender  Back: no cva ttp  Ext: no edema or asymmetry  Neuro: awake, alert, oriented x 3, speech clear and appropriate, mae with nl tone and strength  GU - no hernias     Labs/Studies:     Labs Reviewed   BASIC METABOLIC PANEL, BLOOD - Abnormal; Notable for the following:     Glucose 118 (*)     All other components within normal limits   CBC WITH ADIFF, BLOOD   LIPASE, BLOOD   LIVER PANEL, BLOOD   URINALYSIS   LIVER PANEL, BLOOD     Impression:  52 yo male presenting with L flank pain radiating to groin, hematuria, similar to previous kidney stones. Pain is starting to migrate and pt feels that stone is passing. Has had CT in the past for same so will defer for now. Plan for pain control, ivf, urine, labs, xray kub and renal [place].     Agree w/a/p as d/w resident

## 2012-12-19 NOTE — Discharge Instructions (Signed)
Kidney Stones    You have been seen for a kidney stone.    A kidney stone is a hard mineral and crystalline material (a lot like gravel). It forms inside the kidney or the urinary tract. When it moves from the kidney into the tube between the kidney and the bladder (the ureter), it causes severe pain. Kidney stones form when there is less urine (pee) or too many stone-forming substances in the urine. Normally, kidney stones are made of calcium oxalate or calcium phosphate. Kidney stones associated with infection in the urinary tract are called struvite or infection stones.    Symptoms include sharp pain in the side that may radiate (spread) to the groin. Nausea and vomiting are also common. A doctor diagnosed your kidney stone based on your exam, urine test, and medical history. The doctor may have run a special test called a helical CT stone study or an intravenous pyelogram (IVP).    Men get kidney stones more often than women. Whites get them more often than African-Americans. Kidney stones become more common when men reach their 40s. The risk gets higher with age. People who have already had more than one kidney stone often get more stones.    There are different conditions that can cause kidney stones: Hypercalcuria is an inherited (genetic) condition that causes high calcium in the urine. This causes stones in more than half of cases. There are other conditions that cause an increased risk of kidney stones. These include gout (a joint condition), hyperparathyroidism (a hormone condition), inflammatory bowel disease (Crohn's disease and Ulcerative colitis) and intestinal bypass surgery. They also include kidney diseases like renal tubular acidosis. Certain medicines also increase the risk of kidney stones. These include some diuretics (water pills), antacids with calcium, and the HIV drug Crixivan (indinavir).    Most kidney stones pass on their own. Larger stones or stones that don't pass in a few days  may need to be taken out. This is done by a urologist (a doctor who specializes in the urinary tract). Kidney stones are normally treated with pain and nausea medicine.    You should drink lots of fluid--up to 8 glasses of water a day. You should follow up with a urologist in the next 2-3 weeks. Strain all of your urine so you can catch the kidney stone as it passes out of the bladder. Keep the stone and bring it with you to your urology appointment. The urologist may have the stone tested to find out what it is made of. This may help the urologist recommend diet changes. He or she may also suggest medicines to help prevent more kidney stones.    YOU SHOULD SEEK MEDICAL ATTENTION IMMEDIATELY, EITHER HERE OR AT THE NEAREST EMERGENCY DEPARTMENT, IF ANY OF THE FOLLOWING OCCURS:   The pain gets worse or the medicine isn't enough to treat your pain.   You get sick (nausea) or vomit and can t keep down fluids or pain medicine.   You have a fever or shaking chills.

## 2012-12-19 NOTE — ED Provider Notes (Signed)
Emergency Department Note    CC :   Chief Complaint   Patient presents with   . Flank Pain     pt co l groin pn x 14hrs, states has hx kidney stones.        HPI :   52 year old  Male, [facility], hx of nephrolithiasis, presenting with left flank pain since earlier today. Through the course of the day, the pain has migrated from the left flank down towards the left groin. He states his urine today was blood-tinged. +nausea, no vomiting. Normal BM today, still passing gas. No fevers/chills. No CP or SOB. No lightheadedness.     Past Medical History :   Past Medical History   Diagnosis Date   . Hypertension    . Pure hypercholesterolemia        Past Surgical history :   Past Surgical History   Procedure Laterality Date   . No past surgical history           Patient's Medications   New Prescriptions    No medications on file   Previous Medications    HALOPERIDOL (HALDOL) 2 MG TABLET    Take 1 tablet by mouth at bedtime.    HYDROCODONE-ACETAMINOPHEN (NORCO) 5-325 MG PER TABLET    Take 1 tablet by mouth every 6 hours as needed for Moderate Pain (Pain Score 4-6).    IBUPROFEN (MOTRIN) 600 MG TABLET    Take 1 tablet by mouth every 8 hours as needed for Mild Pain (Pain Score 1-3).    TRAZODONE (DESYREL) 150 MG TABLET    Take 1 tablet by mouth nightly as needed for Insomnia.   Modified Medications    No medications on file   Discontinued Medications    No medications on file       Allergies :  Aspirin; Compazine; and Toradol    Social History :   tobacco : +  etoh : denies  illicit, IV, rx drug abuse : denies  Living situation : lives at home    Family history  :  Hx of CAD in mom and dad    Review of Systems   ROS: A 12 point review of systems was performed and negative except as per HPI    Physical Exam :   4    12/[birth year]/12  1952   BP: 139/84   Pulse: 98   Temp: 97.1 F (36.2 C)   Resp: 16   SpO2: 96%       Gen: Patient appears in pain, moving around in bed, mildly diaphoretic, A&O, behaving appropriately,  non-toxic appearing   HEENT: NC/AT, PERRL. No icterus, ptosis. Normal oropharynx w/out exudates, erythema. Moist mucous membranes.   Neck: Supple, no JVD, no LAD.   Lungs: Normal breath sounds. No wheeze/rales/rhonchi   CV: RRR. Normal heart sounds. No murmurs appreciated   Abdomen: Normal bowel sounds. +TTP of LLQ, no rebound/gaurding. No herniasNo masses, organomegaly.   Back: No CVA tenderness.   Extremities: No cyanosis, edema.   Neurologic: Mentation appropriate. Gait normal. CN II-XII grossly normal.    LABS  Results for orders placed during the hospital encounter of 12/19/12   CBC WITH ADIFF, BLOOD       Result Value Range    WBC 6.5  4.0 - 10.0 1000/mm3    RBC 4.91  4.60 - 6.10 mill/mm3    Hgb 15.4  13.7 - 17.5 gm/dL    Hct 16.1  09.6 -  50.0 %    MCV 87.2  79.0 - 95.0 um3    MCH 31.4  26.0 - 32.0 pgm    MCHC 36.0  32.0 - 36.0 %    RDW 12.4  12.0 - 14.0 %    MPV 11.1  9.4 - 12.4 fL    Plt Count 219  140 - 370 1000/mm3    Segs 58  34 - 71 %    Lymphocytes 34  19 - 53 %    Monocytes 6  5 - 12 %    Eosinophils 2  1 - 7 %    Basophils 1  0 - 2 %    Absolute Neutrophil Count 3.8  1.6 - 7.0 1000/mm3    Abs Lymphs 2.2  0.8 - 3.1 1000/mm3    Abs Monos 0.4  0.2 - 0.8 1000/mm3    Abs Eosinophils 0.1  0.0 - 0.5 1000/mm3    Diff Type Automated     BASIC METABOLIC PANEL, BLOOD       Result Value Range    Glucose 118 (*) 70 - 115 mg/dL    BUN 16  6 - 20 mg/dL    Creatinine 5.62  1.30 - 1.17 mg/dL    GFR >86      Sodium 140  136 - 145 mmol/L    Potassium 3.8  3.5 - 5.1 mmol/L    Chloride 105  98 - 107 mmol/L    Bicarbonate 24  22 - 29 mmol/L    Calcium 9.2  8.6 - 10.0 mg/dL   LIPASE, BLOOD       Result Value Range    Lipase 38  13 - 60 U/L   LIVER PANEL, BLOOD       Result Value Range    Total Protein 7.0  6.0 - 8.0 g/dL    Albumin 4.3  3.5 - 5.2 g/dL    Bilirubin, Dir 0.1  <0.2 mg/dL    Bilirubin, Tot 0.2  <1.2 mg/dL    AST (SGOT) 19  0 - 40 U/L    ALT (SGPT) 27  0 - 41 U/L    Alkaline Phos 93  40 - 129 U/L                  DIAGNOSTIC STUDIES  No results found for this visit on 12/19/12.    Clinical Decision Making   89M with hx of nephrolithiasis presenting left flank pain and hematuria today. States pain feels like his typical renal stones. Labs, pain control, fluids, renal US to assess for hydro. Less likely diverticulitis, AAA, obstruction, mesenteric ischemia.      ED course   UA >50 RBCs, 0-2 WBCs suggesting non-infected stone. WBC count normal at 6.5.     Renal US w/o hydronephrosis. KUB unremarkable.    Pt's pain well controlled with dilaudid -->percocet. Pt expresses wish to go home to pass stone. DC with norco from discharge pharmacy.    My thought process and decision making were discussed with the attending Dr. Lajuana Ripple.      Ree Kida, MD  Resident  12/21/12 [phone]

## 2012-12-20 MED ORDER — HYDROCODONE-ACETAMINOPHEN 5-325 MG OR TABS
1.0000 | ORAL_TABLET | Freq: Four times a day (QID) | ORAL | Status: DC | PRN
Start: 2012-12-20 — End: 2012-12-20

## 2012-12-20 MED ORDER — HYDROCODONE-ACETAMINOPHEN 5-325 MG OR TABS
1.0000 | ORAL_TABLET | Freq: Four times a day (QID) | ORAL | Status: DC | PRN
Start: 2012-12-20 — End: 2013-03-04

## 2012-12-20 NOTE — ED Notes (Signed)
Pt remains painfree.   Discharged home with instructions, a vial of Norco, dispensed by the pharmacist as well as a bus pass.  Pt verbalized understanding of all instructions and ambulated with a steady gait from the tx area.

## 2012-12-21 NOTE — ED Follow-up Note (Signed)
Follow-up type: Callback       Routine ED Patient Call Back    Patient unable to be contacted, no message left

## 2012-12-30 ENCOUNTER — Emergency Department
Admission: EM | Admit: 2012-12-30 | Discharge: 2012-12-30 | Disposition: A | Discharge status: Home / Self Care | Payer: MEDICAID | Attending: Emergency Medicine | Admitting: Emergency Medicine

## 2012-12-30 ENCOUNTER — Encounter (HOSPITAL_COMMUNITY): Payer: Self-pay | Admitting: Emergency Medicine

## 2012-12-30 DIAGNOSIS — Y9289 Other specified places as the place of occurrence of the external cause: Secondary | ICD-10-CM | POA: Insufficient documentation

## 2012-12-30 DIAGNOSIS — M549 Dorsalgia, unspecified: Secondary | ICD-10-CM | POA: Insufficient documentation

## 2012-12-30 DIAGNOSIS — IMO0002 Reserved for concepts with insufficient information to code with codable children: Secondary | ICD-10-CM | POA: Insufficient documentation

## 2012-12-30 DIAGNOSIS — E7800 Pure hypercholesterolemia, unspecified: Secondary | ICD-10-CM | POA: Insufficient documentation

## 2012-12-30 DIAGNOSIS — S3210XA Unspecified fracture of sacrum, initial encounter for closed fracture: Secondary | ICD-10-CM

## 2012-12-30 DIAGNOSIS — G8929 Other chronic pain: Secondary | ICD-10-CM | POA: Insufficient documentation

## 2012-12-30 DIAGNOSIS — I10 Essential (primary) hypertension: Secondary | ICD-10-CM | POA: Insufficient documentation

## 2012-12-30 MED ORDER — ONDANSETRON HCL 4 MG/2ML IV SOLN
4.00 mg | Freq: Once | INTRAMUSCULAR | Status: AC
Start: 2012-12-30 — End: 2012-12-30
  Administered 2012-12-30: 4 mg via INTRAVENOUS
  Filled 2012-12-30: qty 2

## 2012-12-30 MED ORDER — CARVEDILOL 12.5 MG OR TABS
12.50 mg | ORAL_TABLET | Freq: Two times a day (BID) | ORAL | Status: DC
Start: ? — End: 2014-03-17

## 2012-12-30 MED ORDER — OXYCODONE-ACETAMINOPHEN 5-325 MG OR TABS
1.00 | ORAL_TABLET | Freq: Four times a day (QID) | ORAL | Status: DC | PRN
Start: ? — End: 2013-08-15

## 2012-12-30 MED ORDER — OXYCODONE-ACETAMINOPHEN 5-325 MG OR TABS
1.0000 | ORAL_TABLET | Freq: Four times a day (QID) | ORAL | Status: DC | PRN
Start: 2012-12-30 — End: 2013-03-04

## 2012-12-30 MED ORDER — RISPERIDONE 2 MG OR TABS
2.00 mg | ORAL_TABLET | Freq: Two times a day (BID) | ORAL | Status: DC
Start: ? — End: 2013-03-04

## 2012-12-30 MED ORDER — FENTANYL CITRATE 0.05 MG/ML IJ SOLN
50.00 ug | Freq: Once | INTRAMUSCULAR | Status: AC
Start: 2012-12-30 — End: 2012-12-30
  Administered 2012-12-30: 50 ug via INTRAVENOUS
  Filled 2012-12-30: qty 2

## 2012-12-30 MED ORDER — OXYCODONE-ACETAMINOPHEN 5-325 MG OR TABS
1.00 | ORAL_TABLET | Freq: Once | ORAL | Status: AC
Start: 2012-12-30 — End: 2012-12-30
  Administered 2012-12-30: 1 via ORAL
  Filled 2012-12-30: qty 1

## 2012-12-30 MED ORDER — ATORVASTATIN CALCIUM 10 MG OR TABS
10.00 mg | ORAL_TABLET | Freq: Every day | ORAL | Status: DC
Start: ? — End: 2014-03-17

## 2012-12-30 MED ORDER — OXYCODONE-ACETAMINOPHEN 5-325 MG OR TABS
ORAL_TABLET | ORAL | Status: AC
Start: 2012-12-30 — End: 2012-12-30
  Administered 2012-12-30: 1 via ORAL
  Filled 2012-12-30: qty 1

## 2012-12-30 MED ORDER — MORPHINE SULFATE 4 MG/ML IJ SOLN
6.0000 mg | Freq: Once | INTRAMUSCULAR | Status: DC
Start: 2012-12-30 — End: 2012-12-30
  Filled 2012-12-30: qty 2

## 2012-12-30 MED ORDER — OXYCODONE-ACETAMINOPHEN 5-325 MG OR TABS
1.0000 | ORAL_TABLET | Freq: Once | ORAL | Status: DC
Start: 2012-12-30 — End: 2012-12-30

## 2012-12-30 MED ORDER — OXYCODONE-ACETAMINOPHEN 5-325 MG OR TABS
1.00 | ORAL_TABLET | Freq: Once | ORAL | Status: AC
Start: 2012-12-30 — End: 2012-12-30

## 2012-12-30 NOTE — ED Notes (Signed)
Bed:16A<BR> Expected date:<BR> Expected time:<BR> Means of arrival:<BR> Comments:<BR>

## 2012-12-30 NOTE — ED Notes (Signed)
Pt discharge to home. AA&OX4. Pt VU of  discharge instructions and follow up.  Up ad lib with steady gait.  NAD noted. Rx provided

## 2012-12-30 NOTE — ED Notes (Signed)
Pt to Xray via stretcher. C/o no pain relief. Dr. Toma Copier aware

## 2012-12-30 NOTE — ED Notes (Signed)
Pt ambulates with steady gait in hallway. A&OX3. Continues to c/o pain. MD aware. Await further orders.

## 2012-12-30 NOTE — Discharge Instructions (Signed)
Take the medications as prescribed. Follow up with your primary doctor. Return for worsening symptoms as below.     Sacral Fracture    You have been diagnosed with a fracture of your tail bone    A fracture is a break in a bone. It means the same thing as saying a "broken bone." In general, fractures heal over about 6-8 weeks. The broken bone will eventually become stronger at the site of the break than in the surrounding bone.    The treatment for this type of injury involves pain medications, ice placed over the affected area, and relief of pressure on the injured tail bone.   ICE: By applying ice to the affected area, swelling and pain can be reduced. Place some ice cubes in a re-sealable (Ziploc) bag and add some water. Put a thin washcloth between the bag and the skin. Apply the ice bag to the area for at least 20 minutes. Do this at least 4 times per day. Using the ice for longer times and more frequently is OK. NEVER APPLY ICE DIRECTLY TO THE SKIN.    You can purchase a "rubber doughnut" to sit on. This is a large inflatable ring with an open center, resembling a large doughnut. Inflatable doughnuts can be purchased at a medical supply store or you can substitute an inflatable swim toy which resembles a life-preserver.    The pain of a tail bone fracture can be made worse with straining to have a bowel movement. If this happens, you can use an over-the-counter stool softener to help lessen the discomfort.    Although you may still have discomfort for the next few months with sitting, your pain should get a lot get better in 1-2 weeks.    YOU SHOULD SEEK MEDICAL ATTENTION IMMEDIATELY, EITHER HERE OR AT THE NEAREST EMERGENCY DEPARTMENT, IF ANY OF THE FOLLOWING OCCURS:   Increase in pain.   Inability to have a bowel movement or blood in your stools.   Numbness (loss of sensation), tingling, or weakness in your legs or in the area around your rectum and genitals.
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
## 2012-12-30 NOTE — ED Notes (Signed)
Pt c/o severe lower back and right hip pain after falling off ladder this morning. Pt ambulatory post fall but now pain is severe. MAe easily. Respirations even and unlabored. Denies neck pai. Backboard removed by Dr. Toma Copier but c-collar remains in place.

## 2013-01-03 NOTE — ED Follow-up Note (Signed)
Follow-up type: Callback       Routine ED Patient Call Back    Patient unable to be contacted, no message left

## 2013-01-21 ENCOUNTER — Emergency Department
Admission: EM | Admit: 2013-01-21 | Discharge: 2013-01-21 | Disposition: A | Discharge status: Home / Self Care | Payer: MEDICAID | Attending: Emergency Medicine | Admitting: Emergency Medicine

## 2013-01-21 DIAGNOSIS — M5430 Sciatica, unspecified side: Secondary | ICD-10-CM | POA: Insufficient documentation

## 2013-01-21 DIAGNOSIS — Y9301 Activity, walking, marching and hiking: Secondary | ICD-10-CM | POA: Insufficient documentation

## 2013-01-21 DIAGNOSIS — Y9289 Other specified places as the place of occurrence of the external cause: Secondary | ICD-10-CM | POA: Insufficient documentation

## 2013-01-21 DIAGNOSIS — M545 Low back pain, unspecified: Secondary | ICD-10-CM | POA: Insufficient documentation

## 2013-01-21 DIAGNOSIS — R296 Repeated falls: Secondary | ICD-10-CM | POA: Insufficient documentation

## 2013-01-21 MED ORDER — HYDROMORPHONE HCL 1 MG/ML IJ SOLN
1.0000 mg | Freq: Once | INTRAMUSCULAR | Status: AC
Start: 2013-01-21 — End: 2013-01-21
  Administered 2013-01-21: 1 mg via INTRAMUSCULAR
  Filled 2013-01-21: qty 1

## 2013-01-21 MED ORDER — HYDROMORPHONE HCL 1 MG/ML IJ SOLN
1.0000 mg | Freq: Once | INTRAMUSCULAR | Status: DC
Start: 2013-01-21 — End: 2013-01-21

## 2013-01-21 MED ORDER — ACETAMINOPHEN 325 MG PO TABS
975.0000 mg | ORAL_TABLET | Freq: Once | ORAL | Status: AC
Start: 2013-01-21 — End: 2013-01-21
  Administered 2013-01-21: 975 mg via ORAL
  Filled 2013-01-21: qty 3

## 2013-01-21 NOTE — ED Notes (Signed)
Patient req more pain medications as pain is not better.  Patient over to x ray.

## 2013-01-21 NOTE — ED Notes (Signed)
Patient states that he is feeling better and walking around the department without assistance.  Patient a/ox3 with easy breathing.  Patient signed and understood d/c and f/u.

## 2013-01-21 NOTE — Discharge Instructions (Signed)
No new fractures seen on imaging- you have an old sacral fracture that is healing well.  Follow-up this week with your regular physician for more narcotics.      Sciatica    You have been diagnosed with sciatica.    Sciatica is a general term for pain in the low back, hip, or buttocks that travels down the leg. The pain rarely goes below the knees. The pain can be severe and can last for a few days up to a few weeks. A common cause of this type of pain is a pinched nerve from a slipped disk. Sometimes the cause of the pain is never determined.    Treatment includes rest and pain medications. Steroids may be helpful, especially if the cause is a pinched nerve.    YOU SHOULD SEEK MEDICAL ATTENTION IMMEDIATELY, EITHER HERE OR AT THE NEAREST EMERGENCY DEPARTMENT, IF ANY OF THE FOLLOWING OCCURS:   You develop numbness (loss of feeling) or tingling (pins and needles) in your legs.   You develop weakness in your legs that keeps you from standing or walking or makes you stumble or trip over your own feet.   You have trouble controlling your bowels or bladder (you soil or wet yourself).   (MEN) You are unable to have an erection.   Your symptoms become worse.   You have fevers or shaking chills, especially with an increase in back pain.          Fall Prevention (Edu)    You have requested information on Fall Prevention.    According to a 2003 study from the Journal of the [employer], more than 1.8 million adults, aged 15 and older, were treated in emergency departments for fall-related injuries. More than 421,000 were hospitalized. The most common injuries from a fall are head injuries that in turn cause a brain injury, and fractures (broken bones). Of all types of broken bones that happen from falls, hip fractures are the most serious and lead to the greatest number of health problems and deaths.    To make your living area safer, older adults should consider the following:   Improve lighting  throughout the home. Use night-lights to help you see at night.   Have handrails installed on both sides of stairways.   Have grab bars placed next to the toilet and in the shower. Also consider an elevated toilet seat and a shower chair.   Use non-slip bath mats in the tub or shower.   Remove "throw rugs" to prevent tripping.   Avoid the use of long robes to prevent tripping.   Wear well-fitted shoes or slippers. Wearing loose footwear can cause you to shuffle, and make you more likely to trip and fall. Inexpensive anti-slip socks can also be purchased.   Be sure to keep all electrical cords and small objects out of the pathway.   If a cane, walker or any other assistive device is used, be sure to have them inspected regularly. The devices must be used correctly to prevent injuries.   Remember to move about at a pace that is comfortable for your ability. For example, do not rush to answer the doorbell or the phone. Take your time.    In recent studies, a number of risk factors have been identified that make older adults more likely to have falls. It has also been shown that when these risk factors are modified, it will help to prevent falls.   Exercise: Regular physical activity  or exercise increases body strength and improves balance.   Medication Review: Follow up with your doctor and pharmacist as needed to review your medications and any recent changes that may have been made. They can tell you if there are drug interactions and side-effects. If you are taking any sedatives or sleeping pills, it might be possible to have the dosage decreased, or have the number of medications reduced. These kinds of medications can cause drowsiness and dizziness, thus posing a risk of falling.   Vision Checks: Follow up with an eye doctor at least once a year to have your vision checked.

## 2013-01-21 NOTE — ED Attending Note (Signed)
ED ATTENDING NOTE:    Date of encounter: 01/21/2013    Seen and examined with Dr. Sylvan Cheese. Please see house officer's note for full H&P. Key elements of history:    Chief complaint: back pain    HISTORY: This is a 52 year old male with a history of sciatica, chronic lower back pain who presents with a complaint of an exacerbation of his chronic back pain of moderate intensity that started today, after a fall onto his [place] It was sudden in onset, and has a dull quality, located in the lower back, with radiation to the right, but nowhere else.  It is constant, and is worsened by torso movements and leg movements and relieved by nothing the patient can note.  The patient states it is associated with leg numbness thjat is typical of his sciatica, but denies any associated leg weakness, urinary incontinence, stool incontinence, nausea, vomiting, fever, dysuria or other urinary symptoms, abdominal pain, or any other symptom.  The back pain is reminiscent of that which he has had in the past. Apart from the above, the ROS is otherwise unremarkable, and the patient denies any night-sweats or chills.    Key elements of exam:  Vitals noted:  ED Triage Vitals   Enc Vitals Group      Blood pressure (BP) 01/21/13 1236 136/91 mmHg      Heart Rate 01/21/13 1236 66      Respirations 01/21/13 1236 18      Temperature 01/21/13 1236 97.5 F (36.4 C)      Temp src --       SpO2 01/21/13 1236 97 %      Weight - scale 01/21/13 1236 226 lb (102.513 kg)      Height 01/21/13 1236 6' (1.829 m)     Subsequent vitals  BP 107/76  Pulse 58  Temp(Src) 97.5 F (36.4 C)  Resp 20  Ht 6' (1.829 m)  Wt 102.513 kg (226 lb)  BMI 30.64 kg/m2  SpO2 97%  Gen: NAD, WNWD, well-appearing, non-toxic   HEENT: face is without asymmetry; no conjunctival injection; no scleral icterus.  Back: There is some diffuse soft tissue tenderness located to the paraspinal soft tissues in the right  lumbar area, but without any focality  to immediately suggest a focal bony injury.  There is no midline thoracic, lumbar, sacral bony tenderness, crepitus, step-off, or deformity.   Extremities: There is no significant edema or cyanosis  Neuro: awake, alert, and oriented x 3, with fluent and non-dysarthric speech, following commands well; the patient is moving all four extremities well with no gross ataxia, and there are no obvious focal neurological deficits evident. The patient has 5/5 strength grossly in hip flexors, hip extensors, hip abductors, hip adductors knee extension and flexion, ankle dorsi and plantar flexion bilaterally.  Has grossly normal sensation to light touch and pinch.  With regards to DTR's, he has 1+ reflexes in right patellar, left patellar, right achilles and left achilles, respectively.     IMPRESSION: I discussed the case presentation and plan with  Dr. Sylvan Cheese, and agreed with the assessment and plan as discussed. With regards to the patient's back pain, the history and physical was consistent with a differential diagnosis that includes exacerbation of his sciatica, an exacerbation of chronic back pain, non-specific musculoskeletal pain. There is little in the clinical presentation to strongly suggest other pathology, such as an infection (such as an epidural abscess), acute pyelonephritis, nephrolithiasis, malignancy, aortic  dissection or aneurysm, or other pathology, and there are no clinical features to indicate an acute myelopathy, severe acute radiculopathy necessitating emergent surgical intervention, or impending cauda equina syndrome.     MEDICAL DECISION MAKING / PLAN:   We proceeded to work up initiate treatment as per orders, and reassess.        The patient's hip and right femur x-rays were  read by the Radiology Service, with an interpretation as follows (essentially unremarkable):  "FINDINGS:  Bilateral hips: In the region of the transverse lucency seen at the lateral aspect of the left sacrum at the level of S3 on  the prior radiographs of 12/30/2012, there is some mild sclerosis which may represent a healing nondisplaced sacral fracture. The sacroiliac joints are congruent. The hips are congruent without significant degenerative changes. There are some bone islands at the left femoral head and right femoral neck. The soft tissues are normal. The bilateral pars defects at L5 are better seen on the prior CT of 12/16/2012.    Right femur two views: There is no fracture or osseous abnormality. The soft tissues are normal.    IMPRESSION:  New linear sclerosis at the level of the S3 on the left, suggesting a healing sacral fracture nondisplaced. No new injury. Unremarkable appearance of the hips.    Normal appearance of the right femur.    01/21/2013 3:39 PM STATUS ELECTRONICALLY SIGNED BY /   EVELYNE FLISZAR"      The patient has been reassessed and has had improvement in symptoms.  The patient appears well and non-toxic, and should have no issues with treatment as an outpatient and close follow up. The patient was counseled regarding the workup findings and the provisional diagnosis, and all questions were answered. Instructed the patient to follow up with his regular physician, which might include a referral to follow up with an Orthopedic Spine specialist and an MRI of the spine, in the coming days, for a recheck and further evaluation of all symptoms and complaints.  Will discharge the patient with aftercare instructions. In empiric preparation for an eventual possible discharge, the patient was given detailed instructions with regards to return parameters (especially with symptoms of worsening back pain - especially if not responding to medications prescribed, new neurological symptoms such as leg weakness or numbness, problems with bowel or bladder function such as incontinence or retention, vomiting, or fever). The patient was also given detailed verbal instructions with regards to follow-up, and clearly demonstrated an  understanding of and agreement with the instructions and plan.

## 2013-01-21 NOTE — ED Provider Notes (Signed)
History  Chief Complaint   Patient presents with   . Falls     reports right leg "gave out" going up metal stairs 2 hours pta - fell backwards onto buttocks - pain to lower back and right leg - hx of sciatica to same.     HPI  52 yo hx sciatica, chronic lower back pain presenting with lower back apin. Pt states he was walking down stairs, fell and landed on buttocks with immediate pain. Pt states this caused his sciatica to worsen and now has some worsening tingling in his R leg. deneid any focal weakness. No upper back pain, f/c or b/b dysfunction. No other regions of trauma. Notes allergy to morphine, toradol usually gets dilaudid.     Past Medical History   Diagnosis Date   . Hypertension    . Pure hypercholesterolemia        Past Surgical History   Procedure Laterality Date   . Ventral hernia repair         Family History   Problem Relation Age of Onset   . Hypertension Mother    . Heart Disease Mother    . Stroke Mother    . Hypertension Father    . Heart Disease Father    . Stroke Father        History   Substance Use Topics   . Smoking status: Current Every Day Smoker   . Smokeless tobacco: Never Used   . Alcohol Use: No       Review of Systems  10 pt ros conducted negative except per hpi    Physical Exam  BP 107/76  Pulse 58  Temp(Src) 97.5 F (36.4 C)  Resp 20  Ht 6' (1.829 m)  Wt 102.513 kg (226 lb)  BMI 30.64 kg/m2  SpO2 97%    Physical Exam   Nursing note and vitals reviewed.  Constitutional: He is oriented to person, place, and time. He appears well-developed and well-nourished. No distress.   HENT:   Head: Normocephalic and atraumatic.   Eyes: Conjunctivae and EOM are normal. Pupils are equal, round, and reactive to light.   Neck: Normal range of motion. Neck supple.   Cardiovascular: Normal rate and regular rhythm.    Pulmonary/Chest: Effort normal and breath sounds normal.   Abdominal: Soft. Bowel sounds are normal.   Musculoskeletal: Normal range of motion. He exhibits tenderness. He  exhibits no edema.   ttp lower sacrum, coccyx   Neurological: He is alert and oriented to person, place, and time.   ehl 5/5 in rle, hip, knee ankle felxors / extenrs 5/5  Able to ambualte with mild limp  Sensation intact throghuot  +straight leg raise     Skin: Skin is warm and dry. He is not diaphoretic.     X-ray Femur 2 Views - Right    01/21/2013    EXAM DESCRIPTION:  HIP BILAT  MIN 2 VWS EACH HIP + PELVIS; FEMUR 2 VIEWS - RIGHT  CLINICAL HISTORY: Tenderness to palpation low sacral region, fall  COMPARISON: Pelvis radiographs 12/30/2012  FINDINGS: Bilateral hips: In the region of the transverse lucency seen at the lateral  aspect of the left sacrum at the level of S3 on the prior radiographs of  12/30/2012, there is some mild sclerosis which may represent a healing  nondisplaced sacral fracture. The sacroiliac joints are congruent. The hips  are congruent without significant degenerative changes. There are some bone  islands at the left femoral head and  right femoral neck. The soft tissues are  normal. The bilateral pars defects at L5 are better seen on the prior CT of  12/16/2012.  Right femur two views: There is no fracture or osseous abnormality. The soft  tissues are normal.  IMPRESSION: New linear sclerosis at the level of the S3 on the left, suggesting a healing  sacral fracture nondisplaced. No new injury. Unremarkable appearance of the  hips.  Normal appearance of the right femur.      X-ray Hip Bilat Min 2 Views Each Hip + Pelvis    01/21/2013    EXAM DESCRIPTION:  HIP BILAT  MIN 2 VWS EACH HIP + PELVIS; FEMUR 2 VIEWS - RIGHT  CLINICAL HISTORY: Tenderness to palpation low sacral region, fall  COMPARISON: Pelvis radiographs 12/30/2012  FINDINGS: Bilateral hips: In the region of the transverse lucency seen at the lateral  aspect of the left sacrum at the level of S3 on the prior radiographs of  12/30/2012, there is some mild sclerosis which may represent a healing  nondisplaced sacral fracture. The sacroiliac  joints are congruent. The hips  are congruent without significant degenerative changes. There are some bone  islands at the left femoral head and right femoral neck. The soft tissues are  normal. The bilateral pars defects at L5 are better seen on the prior CT of  12/16/2012.  Right femur two views: There is no fracture or osseous abnormality. The soft  tissues are normal.  IMPRESSION: New linear sclerosis at the level of the S3 on the left, suggesting a healing  sacral fracture nondisplaced. No new injury. Unremarkable appearance of the  hips.  Normal appearance of the right femur.    ED Course/Medical Decision Making Narrative  52 yo male with sacral pain, exacerbation of sciatica following mechanical fall. Suspect bony contusion, also consider mild fracture. Sciatica likely worsened 2/2 fall.   Needs pain control, imaging of affected region.   Imaging reviewed- subacute fx of sacrum at S3- but no new pathology.  Reassessed, pain improved following dilaudid.  epxlained to pt that no acute pathology was present, likely contusion. No evidence of CES.    Seen and d/w dr Romona Curls.            Critical Care Time            Additional Notes      Home Medication List  Prior to Admission Medications   Outpatient Medications Last Dose Informant Patient Reported? Taking?   HYDROcodone-acetaminophen (NORCO) 5-325 MG per tablet   No No   Sig: Take 1 tablet by mouth every 6 hours as needed for Moderate Pain (Pain Score 4-6) (Pain).   atorvastatin (LIPITOR) 10 MG tablet   Yes No   Sig: Take 10 mg by mouth daily.   carvedilol (COREG) 12.5 MG tablet   Yes No   Sig: Take 12.5 mg by mouth 2 times daily (with meals).   haloperidol (HALDOL) 2 MG tablet   No No   Sig: Take 1 tablet by mouth at bedtime.   ibuprofen (MOTRIN) 600 MG tablet   No No   Sig: Take 1 tablet by mouth every 8 hours as needed for Mild Pain (Pain Score 1-3).   oxyCODONE-acetaminophen (PERCOCET) 5-325 MG per tablet   Yes No   Sig: Take 1 tablet by mouth every 6 hours  as needed for Severe Pain (Pain Score 7-10).   oxyCODONE-acetaminophen (PERCOCET) 5-325 MG per tablet   No No   Sig: Take  1 tablet by mouth every 6 hours as needed for Severe Pain (Pain Score 7-10).   risperiDONE (RISPERDAL) 2 MG tablet   Yes No   Sig: Take 2 mg by mouth 2 times daily.   traZODone (DESYREL) 150 MG tablet   No No   Sig: Take 1 tablet by mouth nightly as needed for Insomnia.      Facility-Administered Medications: None       Eustace Moore, MD  Resident  01/21/13 [phone]

## 2013-01-21 NOTE — ED Notes (Signed)
Patient here after fall down two stairs and fell onto buttock.

## 2013-01-21 NOTE — ED Notes (Signed)
Patient back form x-ray

## 2013-01-22 NOTE — ED Follow-up Note (Signed)
Follow-up type: Callback       Routine ED Patient Call Back    Patient unable to be contacted, no message left Number disconnected

## 2013-03-04 ENCOUNTER — Emergency Department
Admission: EM | Admit: 2013-03-04 | Discharge: 2013-03-04 | Disposition: A | Discharge status: Home / Self Care | Payer: Medicaid Other | Attending: Emergency Medicine | Admitting: Emergency Medicine

## 2013-03-04 DIAGNOSIS — K029 Dental caries, unspecified: Secondary | ICD-10-CM

## 2013-03-04 DIAGNOSIS — K089 Disorder of teeth and supporting structures, unspecified: Secondary | ICD-10-CM | POA: Insufficient documentation

## 2013-03-04 MED ORDER — HYDROMORPHONE HCL 1 MG/ML IJ SOLN
1.00 mg | Freq: Once | INTRAMUSCULAR | Status: AC
Start: 2013-03-04 — End: 2013-03-04
  Administered 2013-03-04: 1 mg via INTRAMUSCULAR
  Filled 2013-03-04: qty 1

## 2013-03-04 MED ORDER — PENICILLIN V POTASSIUM 500 MG OR TABS
500.0000 mg | ORAL_TABLET | Freq: Two times a day (BID) | ORAL | Status: AC
Start: 2013-03-04 — End: 2013-03-14

## 2013-03-04 MED ORDER — TRAMADOL HCL 50 MG OR TABS
50.00 mg | ORAL_TABLET | Freq: Four times a day (QID) | ORAL | Status: DC | PRN
Start: ? — End: 2013-08-15

## 2013-03-04 NOTE — ED Provider Notes (Signed)
Emergency Dept Provider Note    Chief Complaint:   Chief Complaint   Patient presents with   . Dental Problem     Pt reports to eating tortilla last night and broke off front tooth, states "that nerve is exposed or what."  Pt reports pain 9/10, denies trauma.       HPI:  Glen Fowler is a 52 year old  male with hx sciatica, LBP, HTN, HL, cardiomyopathy who p/w L front tooth pain after broke off a piece of it while eating a soft shell doritos burrito.  Swallowed tooth fragment, feels like nerve root is exposed, initially red/light green, with foul taste.  9/10 pain, constant, burning/nerve pain, has been taking ibuprofen, has taken 6 800mg  since last night.  A/w diaphoresis, nausea, no vomiting.  No cp/sob.  Causing a 5/10 posterior headache, not sudden onset or worst of life.  +chills, unsure if had fever.  Stopped draining.  Have a dentist appointment next week for check up, has been there before for another extraction.  No sore throat/cough/eye pain/vision changes. Worse with cold/hot fluids.    ROS:  12 point review of systems negative other than what is noted in HPI    Home Medications:  Discharge Medication List as of 03/04/2013  2:05 PM      START taking these medications    Details   penicillin V potassium (VEETID) 500 MG tablet Take 1 tablet by mouth 2 times daily., Disp-20 tablet, R-0, Security Rx Print         CONTINUE these medications which have NOT CHANGED    Details   atorvastatin (LIPITOR) 10 MG tablet Take 10 mg by mouth daily., Historical Med      carvedilol (COREG) 12.5 MG tablet Take 12.5 mg by mouth 2 times daily (with meals)., Historical Med      ibuprofen (MOTRIN) 600 MG tablet Take 1 tablet by mouth every 8 hours as needed for Mild Pain (Pain Score 1-3)., Disp-16 tablet, R-0, Security Rx Print      oxyCODONE-acetaminophen (PERCOCET) 5-325 MG per tablet Take 1 tablet by mouth every 6 hours as needed for Severe Pain (Pain Score 7-10)., Historical Med      traMADol (ULTRAM) 50 MG tablet Take 50 mg  by mouth every 6 hours as needed for Moderate Pain (Pain Score 4-6)., Historical Med         STOP taking these medications       haloperidol (HALDOL) 2 MG tablet Comments:   Reason for Stopping:         HYDROcodone-acetaminophen (NORCO) 5-325 MG per tablet Comments:   Reason for Stopping:         risperiDONE (RISPERDAL) 2 MG tablet Comments:   Reason for Stopping:         traZODone (DESYREL) 150 MG tablet Comments:   Reason for Stopping:               Allergies: Morphine; Aspirin; Compazine; and Toradol    Past Medical History:   Past Medical History   Diagnosis Date   . Hypertension    . Pure hypercholesterolemia    chronic lbp    Past Surgical History:   Past Surgical History   Procedure Laterality Date   . Ventral hernia repair     bl hip surgeries    Family History:   Family History   Problem Relation Age of Onset   . Hypertension Mother    . Heart Disease Mother    .  Stroke Mother    . Hypertension Father    . Heart Disease Father    . Stroke Father        Social History:   Tobacco: 2 cig  EtOH: none  Drug abuse (illicit, IV, Rx): none  Living situation: in apt      Physical exam  Vital signs reviewed and noted:  Filed Vitals:    03/04/13 1247 03/04/13 1409   BP: 126/70 122/78   Pulse: 84 77   Temp: 97.5 F (36.4 C)    Resp: 18 17   Height: 6' (1.829 m)    Weight: 102.059 kg (225 lb)    SpO2: 99% 98%       Gen: Patient is alert and well-oriented, in some distress 2/2 pain  HEENT: NCAT, PERRL. Chipped tooth #9, but no obvious pulp/dentin exposure.  Otherwise poor dentition and few teeth remain.    Neck: Supple  Lungs: no resp distress, CTAB no c/w/r  CV: RRR, no m/g/r  Abdomen: soft, NTND, nabs, no rebound/guarding  Back: No CVA tenderness b/l.  Extremities: No cyanosis, no LE edema. MAEW. 2+ radial pulse wwp.  Neurologic: Mentation appropriate      Assessment/Clinical Decision-making/Plan/ED Course  In summary, Glen Fowler is a 52 year old  male with hx sciatica, LBP, HTN, HL, cardiomyopathy who p/w L front  tooth pain after broke off a piece of it while eating a soft shell doritos burrito last night. Exam without definite new tooth fx, pt with poor dentition.  [facility] report revealing multiple Rx for opiates by multiple physicians, filled at multiple pharmacies, most recent being 120 tabs percocet filled 02/10/13.    Plan  -pain control while here  -dc with rx for penicillin, fu with dentist    Medications administered:  Medications   HYDROmorphone (DILAUDID) injection 1 mg (1 mg IntraMUSCULAR Given 03/04/13 1341)     No orders of the defined types were placed in this encounter.         Dispo: home with dentist fu      Patient seen and discussed with attending, Etter Sjogren, *      Tye Savoy, MD  PGY-2 Emergency Medicine      Rock Nephew, MD  Resident  03/04/13 [phone]

## 2013-03-04 NOTE — Discharge Instructions (Signed)
You are being discharged from the [facility] after being seen for the condition detailed later in these instructions. Please refer to the instructions for your specific condition and note the symptoms you should watch out for and when to return to the Emergency Dept. You should return as needed if your symptoms worsen or persist, you are unable to eat, drink, or tolerate your medications, you develop chest pain, shortness of breath, or any other health concerns. Please follow up with your primary care doctor or other specialist as discussed today in your follow up plan within 1-2 days if possible for continuation of care.    Please follow up with your dentist for tooth extraction.  Please take antibiotics as prescribed.  Please return with any new/concerning problems.      Dental Caries    You have been seen for dental caries or cavities.    Dental caries are common. They are caused by loss of tooth enamel and decay of the tooth.    You may feel pain when drinking hot liquids, eating sweets, or chewing.    You will be provided with pain medication. Later, you should follow up with a dentist. To help prevent more dental caries, it is important to practice good dental care. This includes brushing your teeth often, using dental floss, and seeing your dentist regularly.    It is VERY IMPORTANT to follow up with a dentist. We recommend you see a dentist within 2 to 3 days.    YOU SHOULD BE SEEN BY A DENTIST, OR RETURN HERE OR GO TO THE NEAREST EMERGENCY DEPARTMENT IMMEDIATELY IF ANY OF THE FOLLOWING OCCURS:   Your pain gets worse.   You develop swelling of your face, under your tongue, or in your throat.   You develop fever with dental pain.    Emergency and Urgent Care physicians are not dentists. Emergency department or urgent care treatment IS NOT A SUBSTITUTE for treatment by a dentist. You need to follow up with your dentist immediately and arrange to see a dentist on a regular basis.

## 2013-03-04 NOTE — ED Attending Note (Signed)
ED ATTENDING NOTE:    I saw and examined this patient with Dr. Hedwig Morton and agree with the resident's documentation, with any exceptions noted below.    Chief complaint: Dental Problem      History of present illness: Glen Fowler reports that he was he eating a tortilla last night, and he must have found a hard spot in his food. He broke off one of his front teeth. He states that he feels like the nerve is exposed, and he took a few doses of Motrin (despite his chart describing allergies to aspirin and Toradol) with no relief. He states that he does normally use Percocet and tramadol for chronic lower back pain, but he has been out for a few weeks. He denies any other trauma or complaints. Notably, the patient does have 15 prescriptions for opiates dating back to April 4 of this year, with the most recent being a prescription for 120 tablets of Percocet 5/325 filled February 10, 2013. Patient denies fever/chills/night sweats, nausea/vomiting/diarrhea, shortness of breath, chest pain, diaphoresis, rash, travel, or sick contacts.    Past medical history, allergies, and medications were reviewed and confirmed as per the triage intake note.    Family history was reviewed and non-contributory.    Social history was reviewed, and updated in EPIC as appropriate.    Full review of systems performed and is negative except as noted in the HPI.    Physical exam:    4    03/04/13  1247   BP: 126/70   Pulse: 84   Temp: 97.5 F (36.4 C)   Resp: 18   SpO2: 99%     General: No acute distress.  HEENT: Pupils equal, round, and reactive to light.  Normocephalic and atraumatic.  Extraocular motions intact.  Mucous membranes moist.  Naso-oropharynx mostly edentulous; the few remaining teeth are carious and in poor condition, mostly fractured.  The tooth in question (#9) is a sharp stub of remaining tooth.  It is difficult to determine whether there is exposed dentin due to chronic discoloration of the tooth, but there does not appear to  be an acute fracture.  Neck: No lymphadenopathy or jugular venous distention.  Chest: Clear to auscultation bilaterally with normal respiratory effort.  Cardiac: Regular rhythm and rate.  No murmurs, rubs, or gallops.  Abdomen: Soft, non-tender, non-distended, no masses.  Back: No stepoffs, deformities, or tenderness.  No costovertebral angle tenderness.  GU: deferred  Extremities: No cyanosis or edema.  Neurovascularly intact.  Psychiatric: appropriate  Skin: no jaundice or rash  Neurologic: Alert and oriented x4.  Normal speech and language.  Normal gait.     Laboratory values:  Labs Reviewed - No data to display    Impression and medical decision-making: Tooth fracture, likely chronic.  No evidence for jaw fracture, aspirated foreign body, gingival abscess, or deep space infection.    Plan: Pain control here in the Emergency Department only, outpatient dental management with prescription for penicillin.  Due to the patient's multiple narcotic prescriptions recently from multiple providers, about which the patient was not completely honest, in combination with the suspicion that the fracture is more subacute than acute, we are not prescribing any narcotics from our Emergency Department.

## 2013-03-04 NOTE — ED Notes (Signed)
Pt stable for discharge. Instructions/RX reviewed/pt verbalizes understanding. Return to ED if any new/worsening symptoms. Discharge instructions signed/witnessed. Pt ambulatory from ED with steady gait.

## 2013-04-24 ENCOUNTER — Emergency Department
Admission: EM | Admit: 2013-04-24 | Discharge: 2013-04-25 | Disposition: A | Discharge status: Home / Self Care | Payer: Medicaid Other | Attending: Emergency Medicine | Admitting: Emergency Medicine

## 2013-04-24 DIAGNOSIS — H53149 Visual discomfort, unspecified: Secondary | ICD-10-CM | POA: Insufficient documentation

## 2013-04-24 DIAGNOSIS — M545 Low back pain, unspecified: Secondary | ICD-10-CM

## 2013-04-24 DIAGNOSIS — M4802 Spinal stenosis, cervical region: Secondary | ICD-10-CM | POA: Insufficient documentation

## 2013-04-24 DIAGNOSIS — I10 Essential (primary) hypertension: Secondary | ICD-10-CM | POA: Insufficient documentation

## 2013-04-24 DIAGNOSIS — R42 Dizziness and giddiness: Secondary | ICD-10-CM | POA: Insufficient documentation

## 2013-04-24 DIAGNOSIS — M542 Cervicalgia: Secondary | ICD-10-CM | POA: Insufficient documentation

## 2013-04-24 MED ORDER — ONDANSETRON HCL 4 MG/2ML IV SOLN
4.0000 mg | Freq: Once | INTRAMUSCULAR | Status: AC
Start: 2013-04-24 — End: 2013-04-24
  Administered 2013-04-24: 4 mg via INTRAVENOUS
  Filled 2013-04-24: qty 2

## 2013-04-24 MED ORDER — SUMATRIPTAN SUCCINATE 25 MG OR TABS
25.0000 mg | ORAL_TABLET | Freq: Once | ORAL | Status: DC | PRN
Start: 2013-04-24 — End: 2013-04-24
  Filled 2013-04-24: qty 1

## 2013-04-24 MED ORDER — SODIUM CHLORIDE 0.9 % IV SOLN
Freq: Once | INTRAVENOUS | Status: AC
Start: 2013-04-24 — End: 2013-04-25

## 2013-04-24 MED ORDER — DIAZEPAM 5 MG/ML IJ SOLN
5.0000 mg | Freq: Once | INTRAMUSCULAR | Status: AC
Start: 2013-04-24 — End: 2013-04-24
  Administered 2013-04-24: 5 mg via INTRAVENOUS
  Filled 2013-04-24: qty 2

## 2013-04-24 MED ORDER — OXYCODONE-ACETAMINOPHEN 5-325 MG OR TABS
1.0000 | ORAL_TABLET | Freq: Once | ORAL | Status: AC
Start: 2013-04-24 — End: 2013-04-24
  Administered 2013-04-24: 1 via ORAL
  Filled 2013-04-24: qty 1

## 2013-04-24 NOTE — ED Notes (Signed)
This RN assumed care of pt.  Pt showing signs of Vertigo, Migraine and L side HA radiating down L side.  This RN to bs to do Neuro assessment done.  Left pupil seems to be slightly smaller and with less reactivity than R.  Pt states L side of face, L arm and L leg sensation is less than R.  No drift, No facial droop and pt MAE with 5/5 strength BLE/BUE.   Informed Dr. Ardine Eng and Dr. Adelfa Koh notified and to bs to assess pt.

## 2013-04-24 NOTE — ED Notes (Signed)
Bed: 04A  Expected date:   Expected time:   Means of arrival:   Comments:  HA/dizzy

## 2013-04-24 NOTE — ED Provider Notes (Signed)
History  Chief Complaint   Patient presents with   . Headache     bibm for 3 days of left sided HA radiating down neck into shoulder. Today worsened with + photophobia, ringing in L ear, L blurred vision and severe "room spinning." Last headache over 20 yrs ago.     HPI  3-4 days vertigo symptoms. Intermittant. Worse yesterday. Earlier today pt developed left sided HA radiating into left shoulder. Which is why pt presented to ED. Pt states that when he sits up or stands up he becomes very vertiginous, and nauseated. HA feels similar to previous migraines that he had in the 90s. Pt endorses diaphoresis, but no fevers. Left eye blurry. No neck stiffness, no sick contacts. No numbness, paresthesias to any location.   Recent dental procedures, 2 weeks ago. Pt takes percocet chronically for last 7 years for LBP. Recent dental procedures caused pt to consume more meds than normal which is why he is out.      Past Medical History   Diagnosis Date   . Hypertension    . Pure hypercholesterolemia    Cardiomegaly, unclear etiology.   Meds: coreg, meloxicam, lipitor, percocet.     Past Surgical History   Procedure Laterality Date   . Ventral hernia repair         Family History   Problem Relation Age of Onset   . Hypertension Mother    . Heart Disease Mother    . Stroke Mother    . Hypertension Father    . Heart Disease Father    . Stroke Father        History   Substance Use Topics   . Smoking status: Current Every Day Smoker   . Smokeless tobacco: Never Used   . Alcohol Use: No       Review of Systems per hpi all others neg.    Physical Exam  BP 116/78  Pulse 87  Temp(Src) 97.1 F (36.2 C)  Resp 18  Wt 99.791 kg (220 lb)  BMI 29.83 kg/m2  SpO2 97%    Physical Exam   Constitutional: He is oriented to person, place, and time. He appears well-developed and well-nourished. No distress.   HENT:   Head: Normocephalic and atraumatic.   Right Ear: External ear normal.   Left Ear: External ear normal.   Mouth/Throat: Oropharynx  is clear and moist.   Eyes: Conjunctivae and EOM are normal. Pupils are equal, round, and reactive to light.   Neck: No JVD present.   Cardiovascular: Normal rate and regular rhythm.  Exam reveals no friction rub.    No murmur heard.  Pulmonary/Chest: Effort normal and breath sounds normal. No respiratory distress. He has no wheezes.   Abdominal: Soft. He exhibits no distension. There is no tenderness. There is no rebound and no guarding.   Musculoskeletal: Normal range of motion. He exhibits no edema.   Lymphadenopathy:     He has no cervical adenopathy.   Neurological: He is alert and oriented to person, place, and time.   Subjective decreased left facial sensation. No other CN deficits noted. No nystagmus. Romberg/pronator drift neg. Head impulse maneuver does not produce nystagmus. Strength/sensation 5/5 to bue ble.       ED Course/Medical Decision Making Narrative  50M with vertigo like symptoms and L sided HA. Concern for posterior circulation issue. Will get basic labs, CT head, CTA neck. Possible MRI. BMP wnl. lfts wnl. Cbc wnl.     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 Neuro consulted for reccs. F/u neuro stroke clinic. Treatment includes ASA which pt is allergic to. No further imaging indicated at this time.    Pt has f/u with pcp in 3 days. He is requesting percocet to get through until pcp can see him. Will give enough for three days with the understanding that the pt cannot come to ED for refill in the future.           Home Medication List  Prior to Admission Medications   Outpatient Medications Last Dose Informant Patient Reported? Taking?   atorvastatin (LIPITOR) 10 MG tablet   Yes No   Sig: Take 10 mg by mouth daily.   carvedilol (COREG) 12.5 MG tablet   Yes No   Sig: Take 12.5 mg by mouth 2 times daily (with meals).   ibuprofen (MOTRIN) 600 MG tablet   No No   Sig: Take 1 tablet by mouth every 8 hours as needed for Mild Pain (Pain Score 1-3).    oxyCODONE-acetaminophen (PERCOCET) 5-325 MG per tablet   Yes No   Sig: Take 1 tablet by mouth every 6 hours as needed for Severe Pain (Pain Score 7-10).   traMADol (ULTRAM) 50 MG tablet   Yes No   Sig: Take 50 mg by mouth every 6 hours as needed for Moderate Pain (Pain Score 4-6).      Facility-Administered Medications: None       Laney Pastor, MD  Resident  04/25/13 [phone]

## 2013-04-24 NOTE — ED Notes (Signed)
Blood samples collected/labeled and sent to the lab.

## 2013-04-25 MED ORDER — MECLIZINE HCL 25 MG OR TABS
25.0000 mg | ORAL_TABLET | Freq: Once | ORAL | Status: AC
Start: 2013-04-25 — End: 2013-04-25
  Administered 2013-04-25: 25 mg via ORAL
  Filled 2013-04-25: qty 1

## 2013-04-25 MED ORDER — HYDROMORPHONE HCL 1 MG/ML IJ SOLN
0.5000 mg | Freq: Once | INTRAMUSCULAR | Status: AC
Start: 2013-04-25 — End: 2013-04-25
  Administered 2013-04-25: 0.5 mg via INTRAVENOUS
  Filled 2013-04-25: qty 0.5

## 2013-04-25 MED ORDER — DIAZEPAM 5 MG OR TABS
5.0000 mg | ORAL_TABLET | Freq: Four times a day (QID) | ORAL | Status: DC | PRN
Start: 2013-04-25 — End: 2013-08-15

## 2013-04-25 MED ORDER — HYDROCODONE-ACETAMINOPHEN 5-325 MG OR TABS
1.0000 | ORAL_TABLET | Freq: Four times a day (QID) | ORAL | Status: DC | PRN
Start: 2013-04-25 — End: 2013-04-25

## 2013-04-25 MED ORDER — OXYCODONE-ACETAMINOPHEN 5-325 MG OR TABS
1.0000 | ORAL_TABLET | Freq: Once | ORAL | Status: AC
Start: 2013-04-25 — End: 2013-04-25
  Administered 2013-04-25: 1 via ORAL
  Filled 2013-04-25: qty 1

## 2013-04-25 MED ORDER — MECLIZINE HCL 25 MG OR TABS
25.0000 mg | ORAL_TABLET | Freq: Three times a day (TID) | ORAL | Status: AC | PRN
Start: 2013-04-25 — End: ?

## 2013-04-25 MED ORDER — DIAZEPAM 5 MG OR TABS
5.0000 mg | ORAL_TABLET | Freq: Four times a day (QID) | ORAL | Status: DC | PRN
Start: 2013-04-25 — End: 2013-04-25

## 2013-04-25 MED ORDER — OXYCODONE HCL 5 MG OR TABS
5.0000 mg | ORAL_TABLET | Freq: Once | ORAL | Status: AC
Start: 2013-04-25 — End: 2013-04-25
  Administered 2013-04-25: 5 mg via ORAL
  Filled 2013-04-25: qty 1

## 2013-04-25 MED ORDER — MECLIZINE HCL 25 MG OR TABS
25.0000 mg | ORAL_TABLET | Freq: Three times a day (TID) | ORAL | Status: DC | PRN
Start: 2013-04-25 — End: 2013-04-25

## 2013-04-25 MED ORDER — HYDROCODONE-ACETAMINOPHEN 5-325 MG OR TABS
1.0000 | ORAL_TABLET | Freq: Four times a day (QID) | ORAL | Status: DC | PRN
Start: 2013-04-25 — End: 2013-08-15

## 2013-04-25 NOTE — ED Notes (Signed)
Pt ambulated to the BR without assistance. Steady gait. Expressing relief of dizziness

## 2013-04-25 NOTE — ED Notes (Signed)
[id number]  [04/25/2013 12:37 AM  jmstark]PTs iv access not adequate for study. Piv placement x 2 on table w/o success. Non con head complete. 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    Pt reassessed, headache is better, vertigo is better, both still present. Gait is stable. Dizziness has been for 3-4d, positional. Given abnl ct, will discuss with neuro to determine if any further acute tx/testing is needed    Haywood Meinders, Rosaria Ferries, MD  04/25/13 [phone]

## 2013-04-25 NOTE — ED Notes (Signed)
PIV removed intact and dressing placed. Dc instructions provided with verbalized understanding to follow-up his PMD/neuro and to return sooner with any problems/concerns. Pt is aa&ox3, denies any complaints at this time. Ambulated out of the ED with steady/even gait. rx for valium/norco/meclizine provided

## 2013-04-25 NOTE — ED Attending Note (Signed)
ED ATTENDING NOTE:    Patient seen and examined, history and physical exam reviewed.  Case discussed with Dr. Adelfa Koh.  I agree w/ resident's assessment and plan.    Key elements of history and physical exam include:  52 year old male with c/o severe L sided ha going to neck, also vertigo that started a few days ago prior to ha.  Pt has had migraines in the past but not for years.  This headache is among worst in his life but denies sudden onset, has worsened over past 2-3 days along w/ worsening vertigo, dizzy w./ any position change but no vomiting.  Had dental extractions 2 wks ago.     PE:  WNWD male  uncomfortable-appearing  BP 110/64  Pulse 67  Temp(Src) 97.1 F (36.2 C)  Resp 18  Wt 99.791 kg (220 lb)  BMI 29.83 kg/m2  SpO2 97%  HEENT: Perrl, eomi. No nystagmus.  Neck: supple, no jvd  Chest: clear bs bilat  CV: rrr, no m/r/g  ABD: soft, nd, nt, nabs throughout  EXT: no edema, calves snt  Neuro: alert/oriented x 3, CN 2-12 intact, 5/5 bilat grips, pedal push/toe and leg raise.  Sensory intact to LT throughout.     Diagnostic Studies:  O2 sat interpretation: normal on RA  Results for orders placed during the hospital encounter of 04/24/13   CBC WITH ADIFF, BLOOD       Result Value Range    WBC 7.8  4.0 - 10.0 1000/mm3    RBC 4.97  4.60 - 6.10 mill/mm3    Hgb 15.8  13.7 - 17.5 gm/dL    Hct 21.3  08.6 - 57.8 %    MCV 86.5  79.0 - 95.0 um3    MCH 31.8  26.0 - 32.0 pgm    MCHC 36.7 (*) 32.0 - 36.0 %    RDW 12.1  12.0 - 14.0 %    MPV 11.1  9.4 - 12.4 fL    Plt Count 253  140 - 370 1000/mm3    Segs 58  34 - 71 %    Lymphocytes 33  19 - 53 %    Monocytes 6  5 - 12 %    Eosinophils 2  1 - 7 %    Basophils 1  0 - 2 %    Absolute Neutrophil Count 4.5  1.6 - 7.0 1000/mm3    Abs Lymphs 2.5  0.8 - 3.1 1000/mm3    Abs Monos 0.5  0.2 - 0.8 1000/mm3    Abs Eosinophils 0.2  0.0 - 0.5 1000/mm3    Abs Basophils 0.1  0.0 - 0.1 1000/mm3    Diff Type Automated     COMPREHENSIVE METABOLIC PANEL, BLOOD       Result Value Range     Glucose 91  70 - 115 mg/dL    BUN 19  6 - 20 mg/dL    Creatinine 4.69  6.29 - 1.17 mg/dL    GFR >52      Sodium 138  136 - 145 mmol/L    Potassium 3.3 (*) 3.5 - 5.1 mmol/L    Chloride 103  98 - 107 mmol/L    Bicarbonate 22  22 - 29 mmol/L    Calcium 8.8  8.6 - 10.0 mg/dL    Total Protein 7.2  6.0 - 8.0 g/dL    Albumin 4.2  3.5 - 5.2 g/dL    Bilirubin, Tot 0.3  <1.2 mg/dL    AST (  SGOT) 16  0 - 40 U/L    ALT (SGPT) 22  0 - 41 U/L    Alkaline Phos 60  40 - 129 U/L         Impression/Medical Decision Making:  Middle aged male presenting w/ severe headache and vertigo 3-4 d duration also neck pain.  Constellation of sx raises possibility of intracranial process, CVA vs dissection?  More likely atypical migraine but age and fhx of CVA, also pmh of HTN makes it prudent to screen for potentially lethal pathology.  Will also treat vertigo w/ valium vs meclizine, ivf, analgesia for ha.    If CT neg, and sx persist, will obtain MRI to r/o posterior process.

## 2013-04-25 NOTE — ED Follow-up Note (Signed)
Follow-up type: Callback       Routine ED Patient Call Back    Patient unable to be contacted, no message left

## 2013-04-25 NOTE — ED Notes (Signed)
This RN placed deep brachial RUA 20G, 1.88.  Flushes and draws great by this RN, Dr. Toma Copier and Jonny Ruiz, CT.  Jonny Ruiz, CT aware pt has deep brachial and ok'd to use for CTA neck. Pt in NAD.

## 2013-04-25 NOTE — ED Notes (Signed)
Pt got CT head w/o contrast.  This RN attempt RAC, unsuccessful.  Dr. Adelfa Koh informed this RN to attempt [place] with Dr. Toma Copier.

## 2013-04-25 NOTE — Discharge Instructions (Signed)
Dizziness, Nonspecific    You have been seen for dizziness.     Dizziness can mean different things to different people. Some people use dizziness to mean the feeling of spinning when there is no actual movement. This often causes nausea (feeling sick). The medical term for this is "vertigo." Others people use the word dizzy to mean "feeling lightheaded," like you might faint. This feeling is usually made better when lying down. For some people, neither of these describes how they are feeling. It can just be a feeling that makes you unsteady. This feeling is common in older people. It can be caused by a number of things. These include poor vision or hearing, foot problems and arthritis. It can also be caused by middle ear or sinus problems. The feeling can come and go.    Dizziness is also caused by more serious things. This includes strokes and heart problems.    It is NEVER normal to have the kind of dizziness you have today together with:   Chest pain.   Problems walking because of problems with balance. Especially if you are falling to one side.   Weakness, numbness or tingling in a part of your body.   Drooping of one side of your face.   Confusion.   Severe headache.   Problems speaking.     If you have these symptoms, it is VERY IMPORTANT to go to the nearest emergency department.    Your tests today were negative (normal). This means we found no life-threatening causes for your dizziness. It is safe for you to go home.    See your primary care doctor for more work-up of your dizziness.     YOU SHOULD SEEK MEDICAL ATTENTION IMMEDIATELY, EITHER HERE OR AT THE NEAREST EMERGENCY DEPARTMENT, IF ANY OF THE FOLLOWING OCCUR:   You cannot speak clearly (slurring), one side of your face droops or you feel weak in the arms or legs (especially on one side).   You have problems with your balance.   You have problems hearing or there is ringing or a feeling of fullness in your ear.   You lose  consciousness ("pass out" or faint).   You have severe headache with dizziness.   You have fever greater than 100.4F (38C).   You fall and hit your head.

## 2013-04-25 NOTE — ED Notes (Signed)
CT has been completed. Pt medicated for pain as ordered  Pt sitting at bedside to urinate. Enc not to stand d/t dizziness which he verbalized agreement too

## 2013-04-25 NOTE — ED Notes (Signed)
Signed out   3d of headache and neck pain, vertigo, worst he's had. Had migraines in the remote past but does not remember how this occurred. Vertigo preceded headache. Plan for head ct and ct angio neck. Chronic pain, on percocet, has been tapering off. If imaging neg, plan to reassess, if still symptomatic, then consult neuro     Ambria Mayfield, Rosaria Ferries, MD  04/25/13 [phone]

## 2013-04-25 NOTE — ED Notes (Signed)
Pt ambulating around the ED without difficulty. States that he needs to walk d/t chronic back pain. Awaiting dispo

## 2013-05-19 ENCOUNTER — Encounter (HOSPITAL_COMMUNITY): Payer: Self-pay

## 2013-05-19 ENCOUNTER — Emergency Department
Admission: EM | Admit: 2013-05-19 | Discharge: 2013-05-19 | Disposition: A | Discharge status: Home / Self Care | Payer: Medicaid Other | Attending: Emergency Medicine | Admitting: Emergency Medicine

## 2013-05-19 DIAGNOSIS — W19XXXA Unspecified fall, initial encounter: Secondary | ICD-10-CM | POA: Insufficient documentation

## 2013-05-19 DIAGNOSIS — Y929 Unspecified place or not applicable: Secondary | ICD-10-CM | POA: Insufficient documentation

## 2013-05-19 DIAGNOSIS — M549 Dorsalgia, unspecified: Secondary | ICD-10-CM | POA: Insufficient documentation

## 2013-05-19 DIAGNOSIS — R42 Dizziness and giddiness: Secondary | ICD-10-CM | POA: Insufficient documentation

## 2013-05-19 HISTORY — DX: Dizziness and giddiness: R42

## 2013-05-19 HISTORY — DX: Unspecified osteoarthritis, unspecified site: M19.90

## 2013-05-19 HISTORY — DX: Occlusion and stenosis of unspecified carotid artery: I65.29

## 2013-05-19 HISTORY — DX: Sciatica, unspecified side: M54.30

## 2013-05-19 MED ORDER — HYDROCODONE-ACETAMINOPHEN 10-325 MG OR TABS
1.0000 | ORAL_TABLET | Freq: Four times a day (QID) | ORAL | Status: DC | PRN
Start: 2013-05-19 — End: 2013-08-15

## 2013-05-19 MED ORDER — HYDROCODONE-ACETAMINOPHEN 5-325 MG OR TABS
1.0000 | ORAL_TABLET | Freq: Four times a day (QID) | ORAL | Status: DC | PRN
Start: 2013-05-19 — End: 2013-08-15

## 2013-05-19 MED ORDER — HYDROMORPHONE HCL 1 MG/ML IJ SOLN
1.0000 mg | Freq: Once | INTRAMUSCULAR | Status: AC
Start: 2013-05-19 — End: 2013-05-19
  Administered 2013-05-19: 1 mg via INTRAMUSCULAR
  Filled 2013-05-19: qty 1

## 2013-05-19 MED ORDER — OXYCODONE-ACETAMINOPHEN 5-325 MG OR TABS
2.0000 | ORAL_TABLET | Freq: Once | ORAL | Status: AC
Start: 2013-05-19 — End: 2013-05-19
  Administered 2013-05-19: 2 via ORAL
  Filled 2013-05-19: qty 2

## 2013-05-19 MED ORDER — MECLIZINE HCL 25 MG OR TABS
25.0000 mg | ORAL_TABLET | Freq: Once | ORAL | Status: AC
Start: 2013-05-19 — End: 2013-05-19
  Administered 2013-05-19: 25 mg via ORAL
  Filled 2013-05-19: qty 1

## 2013-05-19 MED ORDER — ONDANSETRON 4 MG OR TBDP
8.0000 mg | ORAL_TABLET | Freq: Once | ORAL | Status: AC
Start: 2013-05-19 — End: 2013-05-19
  Administered 2013-05-19: 8 mg via ORAL
  Filled 2013-05-19: qty 2

## 2013-05-19 NOTE — ED Notes (Signed)
Shuffling gait noted with c/o of dizziness that comes and goes. Ambulatory with assistance

## 2013-05-19 NOTE — ED Notes (Signed)
Patient diagnosed with Vertigo about a month ago and was at the back today when he had a dizzy spell and fell to the ground.  He reports hitting his butt on the ground.  Patient has chronic back pain and has been seen by pain management in [place] where he used to live.  Since he moved and has had some insurance changes he has been uncontrolled with his pain.  PCP in [place] is working on things to help him resolve this issue.

## 2013-05-19 NOTE — ED Attending Note (Signed)
Patient was seen and discussed with resident.     CC. Falls and Vertigo      HPI. Patient is a 52 year old male with intermittent vertigo presents with lower back pain radiation to RLE after fall during acute episode of vertigo. No weakness or numbness.     Past Medical History   Diagnosis Date   . Hypertension    . Pure hypercholesterolemia    . Sciatic pain    . DJD (degenerative joint disease)    . Osteoarthritis    . Vertigo    . Stenosis of carotid artery      Past Surgical History   Procedure Laterality Date   . Ventral hernia repair       No current facility-administered medications for this encounter.     Current Outpatient Prescriptions   Medication Sig   . atorvastatin (LIPITOR) 10 MG tablet Take 10 mg by mouth daily.   . carvedilol (COREG) 12.5 MG tablet Take 12.5 mg by mouth 2 times daily (with meals).   . diazepam (VALIUM) 5 MG tablet Take 1 tablet by mouth every 6 hours as needed for Anxiety.   Marland Kitchen HYDROcodone-acetaminophen (NORCO) 10-325 MG per tablet Take 1 tablet by mouth every 6 hours as needed for Moderate Pain (Pain Score 4-6).   Marland Kitchen HYDROcodone-acetaminophen (NORCO) 5-325 MG per tablet Take 1 tablet by mouth every 6 hours as needed for Moderate Pain (Pain Score 4-6).   Marland Kitchen HYDROcodone-acetaminophen (NORCO) 5-325 MG per tablet Take 1 tablet by mouth every 6 hours as needed for Moderate Pain (Pain Score 4-6).   Marland Kitchen ibuprofen (MOTRIN) 600 MG tablet Take 1 tablet by mouth every 8 hours as needed for Mild Pain (Pain Score 1-3).   . meclizine (ANTIVERT) 25 MG tablet Take 1 tablet by mouth 3 times daily as needed for Dizziness.   Marland Kitchen oxyCODONE-acetaminophen (PERCOCET) 5-325 MG per tablet Take 1 tablet by mouth every 6 hours as needed for Severe Pain (Pain Score 7-10).   . traMADol (ULTRAM) 50 MG tablet Take 50 mg by mouth every 6 hours as needed for Moderate Pain (Pain Score 4-6).     Allergies   Allergen Reactions   . Morphine Rash     Per patient, tolerates hydromorphone (Dilaudid)   . Aspirin Unspecified   .  Compazine Unspecified   . Toradol Unspecified     Family History   Problem Relation Age of Onset   . Hypertension Mother    . Heart Disease Mother    . Stroke Mother    . Hypertension Father    . Heart Disease Father    . Stroke Father        EXAM  BP 129/75  Pulse 74  Temp(Src) 97.2 F (36.2 C)  Resp 16  Ht 6' (1.829 m)  Wt 102.059 kg (225 lb)  BMI 30.51 kg/m2  SpO2 96%  GEN NAD  HEENT EOMI PERRL anicteric OP clear  NECK No LAN, No thyromegaly  CHEST CTAB, no chest wall tenderness  CVS RRR, no MRG, well perfused in all extremities  ABD soft NDNT  SKIN no rash  BACK lumbar tenderness, + SLR on right,   EXT no edema or tenderness with strong symmetric peripheral pulses  NEURO alert gait normal sens intact to LT DTRs normal    IMPRESSION  Vertigo  Back pain after fall    PLAN   Xray  Analgesia  Cane per patient request  Reassess

## 2013-05-19 NOTE — ED Provider Notes (Signed)
Emergency Department Note    CC :   Chief Complaint   Patient presents with   . Falls     patient was walking, felt dizziness due to hx of vertigo and went to grab a side rail and miss the side rail causing him to fall on his buttock, neg loc, neg head or neck pain. co. of severe lower back pain that radiates to right leg.    . Vertigo       HPI :   52 year old  male with hx chronic back pain, diagnosed with peripheral vertigo one month ago, presenting after fall which patient sates was triggered by his vertigo. Pt was on some steps, started to feel dizzy, attempted to grab the handrail but missed and fell onto his butt. Now complaining of lower back pain, primarily on the right sided. Does not radiate to the legs. Pt did not hit his head, no LOC. No other complaints. Pt on meclazine for veritgo with some improvement in symptoms. Vertigo is triggered by movement, tends to resolve in 1-2 minutes. Has been following with his PMD. Had negative CT head and CTA head/neck on 8/25 here.     Past Medical History :   Past Medical History   Diagnosis Date   . Hypertension    . Pure hypercholesterolemia    . Sciatic pain    . DJD (degenerative joint disease)    . Osteoarthritis    . Vertigo    . Stenosis of carotid artery        Past Surgical history :   Past Surgical History   Procedure Laterality Date   . Ventral hernia repair           Patient's Medications   New Prescriptions    No medications on file   Previous Medications    ATORVASTATIN (LIPITOR) 10 MG TABLET    Take 10 mg by mouth daily.    CARVEDILOL (COREG) 12.5 MG TABLET    Take 12.5 mg by mouth 2 times daily (with meals).    DIAZEPAM (VALIUM) 5 MG TABLET    Take 1 tablet by mouth every 6 hours as needed for Anxiety.    HYDROCODONE-ACETAMINOPHEN (NORCO) 5-325 MG PER TABLET    Take 1 tablet by mouth every 6 hours as needed for Moderate Pain (Pain Score 4-6).    IBUPROFEN (MOTRIN) 600 MG TABLET    Take 1 tablet by mouth every 8 hours as needed for Mild Pain (Pain  Score 1-3).    MECLIZINE (ANTIVERT) 25 MG TABLET    Take 1 tablet by mouth 3 times daily as needed for Dizziness.    OXYCODONE-ACETAMINOPHEN (PERCOCET) 5-325 MG PER TABLET    Take 1 tablet by mouth every 6 hours as needed for Severe Pain (Pain Score 7-10).    TRAMADOL (ULTRAM) 50 MG TABLET    Take 50 mg by mouth every 6 hours as needed for Moderate Pain (Pain Score 4-6).   Modified Medications    No medications on file   Discontinued Medications    No medications on file       Allergies :  Morphine; Aspirin; Compazine; and Toradol    Social History :   tobacco : +  etoh : denies  illicit, IV, rx drug abuse : denies  Living situation : lives at home    Family history  :  negative    Review of Systems   ROS: A 12 point review of systems was  performed and negative except as per HPI    Physical Exam :   4    05/19/13  1441   BP: 135/96   Pulse: 75   Temp: 96.9 F (36.1 C)   Resp: 16   SpO2: 96%       GEN NAD   HEENT EOMI PERRL anicteric OP clear  NECK NT, c-spine cleared clinically   CHEST CTAB, no chest wall tenderness   CVS RRR, no MRG, well perfused in all extremities   ABD soft NDNT   SKIN no rash   BACK TTP of lower back l4/l5 region, R paraspinal > midline.   EXT no edema or tenderness with strong symmetric peripheral pulses. No numbness/weakness.  NEURO alert motor 5/5= sens intact to LT        LABS  Results for orders placed during the hospital encounter of 04/24/13   CBC WITH ADIFF, BLOOD       Result Value Range    WBC 7.8  4.0 - 10.0 1000/mm3    RBC 4.97  4.60 - 6.10 mill/mm3    Hgb 15.8  13.7 - 17.5 gm/dL    Hct 29.5  62.1 - 30.8 %    MCV 86.5  79.0 - 95.0 um3    MCH 31.8  26.0 - 32.0 pgm    MCHC 36.7 (*) 32.0 - 36.0 %    RDW 12.1  12.0 - 14.0 %    MPV 11.1  9.4 - 12.4 fL    Plt Count 253  140 - 370 1000/mm3    Segs 58  34 - 71 %    Lymphocytes 33  19 - 53 %    Monocytes 6  5 - 12 %    Eosinophils 2  1 - 7 %    Basophils 1  0 - 2 %    Absolute Neutrophil Count 4.5  1.6 - 7.0 1000/mm3    Abs Lymphs 2.5  0.8  - 3.1 1000/mm3    Abs Monos 0.5  0.2 - 0.8 1000/mm3    Abs Eosinophils 0.2  0.0 - 0.5 1000/mm3    Abs Basophils 0.1  0.0 - 0.1 1000/mm3    Diff Type Automated     COMPREHENSIVE METABOLIC PANEL, BLOOD       Result Value Range    Glucose 91  70 - 115 mg/dL    BUN 19  6 - 20 mg/dL    Creatinine 6.57  8.46 - 1.17 mg/dL    GFR >96      Sodium 138  136 - 145 mmol/L    Potassium 3.3 (*) 3.5 - 5.1 mmol/L    Chloride 103  98 - 107 mmol/L    Bicarbonate 22  22 - 29 mmol/L    Calcium 8.8  8.6 - 10.0 mg/dL    Total Protein 7.2  6.0 - 8.0 g/dL    Albumin 4.2  3.5 - 5.2 g/dL    Bilirubin, Tot 0.3  <1.2 mg/dL    AST (SGOT) 16  0 - 40 U/L    ALT (SGPT) 22  0 - 41 U/L    Alkaline Phos 60  40 - 129 U/L         DIAGNOSTIC STUDIES  No results found for this visit on 05/19/13.    Clinical Decision Making   58M presenting after fall in context of vertigo. Vertigo not worse than baseline, no HA, recent negative CT and CTA.     Pt with  low back pain, otherwise no e/o injury from fall. Eval with XR l-spine. Pain meds, meclizine, reassess.     ED course   XR negative acute.    Pt ambulated without difficulty after pain meds, stable for DC. ED return precautions discussed.     My thought process and decision making were discussed with the attending Dr. Elon Spanner, MD  Resident  05/21/13 [phone]

## 2013-05-19 NOTE — ED Notes (Signed)
Pt given a meal tray 

## 2013-05-19 NOTE — ED Notes (Signed)
Bed: 07A  Expected date:   Expected time:   Means of arrival:   Comments:

## 2013-05-19 NOTE — ED Notes (Signed)
c collar and back board removed by ER resident.  Cleared from spine precautions

## 2013-05-19 NOTE — ED Notes (Signed)
Gait steady with cane. Denies dizziness at this time. Pain 2/10 but lives at 2/10. Stable at this time. verbalizes all understanding of DC instruction.

## 2013-05-19 NOTE — Discharge Instructions (Signed)
Vertigo    You have been diagnosed with vertigo.    Vertigo means "the feeling of spinning." People with vertigo have an intense feeling that the room is spinning. This is often called "dizziness."    Most of the time the cause of vertigo is not serious. The most common cause is a balance problem in the inner ear.    The usual treatment is medication to help relieve the spinning feeling and to control nausea.    Your type of vertigo is called benign positional vertigo. This type of vertigo results from crystals (called otoliths) in the canals in the inner ear. The canals are fluid-filled tubes that help with balance. When the crystals move within the canals, it interferes with nerve signals and sends wrong information about balance. This causes nystagmus (abnormal eye movement) and the feeling of spinning.   This type of vertigo can last up to 6 weeks but usually improves sooner. Medication can help. Your vertigo is not dangerous but it can be inconvenient.    DO NOT drive a motor vehicle or operate any other equipment that requires concentration until your symptoms have resolved. Be very careful going up and down stairs.    If your symptoms continue your doctor may order an MRI of your brain to make sure there is not a more serious cause of your vertigo.    YOU SHOULD SEEK MEDICAL ATTENTION IMMEDIATELY, EITHER HERE OR AT THE NEAREST EMERGENCY DEPARTMENT, IF ANY OF THE FOLLOWING OCCURS:   You feel numbness, tingling, or weakness in your arms or legs or become unable to walk.   Your symptoms become worse, even with medication.   You have a severe headache.   You have vomiting that makes it hard to take or keep down medication.          Back Pain NOS    You have been seen for back pain.    Back pain can happen anywhere from the neck down to the low back. Back pain has many different causes. Some of the more common are: Bone pain, muscle strain, muscle spasm, pain from overuse, and pinched nerves. Other  problems can cause what feels like back pain. But the pain is really coming from another organ. This could be a kidney infection. This can cause lower back pain.    The x-rays of your back showed no broken bones.    The doctor still does not know the exact cause of your pain. Your problem does not seem to be from a dangerous cause. It is safe for you to go home today.    Some things you can try to help your back feel better are:   Apply a warm damp washcloth to the back where you have pain for 20 minutes at a time, at least 4 times per day.    Have someone massage the sore parts of your back.    Don t do any heavy lifting or bending. You can go back to normal daily activities if they don t make the pain worse.   You can use anti-inflammatory pain medicine for your pain. This could be Ibuprofen (Advil or Motrin). You can buy these at most stores. Follow the directions on the package.    This pain may last for the next few days. If your pain gets better, you probably do not need to see a doctor. However, if your symptoms get worse or you have new symptoms, you should return here or go  to the nearest Emergency Department.    Call your physician or go to the nearest Emergency Department if you your pain does not improve within 4 weeks or your pain is bad enough to seriously limit your normal activities.    YOU SHOULD SEEK MEDICAL ATTENTION IMMEDIATELY, EITHER HERE OR AT THE NEAREST EMERGENCY DEPARTMENT, IF ANY OF THE FOLLOWING OCCURS:   - You think the pain is coming from somewhere other than your back. This can include chest pain. This is sometimes from angina (heart pains) or other dangerous causes.   You have shortness of breath, sweating, chest pain (or pressure, heaviness, indigestion, etc).   You have abdominal (belly) pain that goes through to your back.   Your arms and legs tingle or get numb (lose feeling).   Your arms or legs are weak.   You lose control of your bladder or bowels. If this  were to happen, it may cause you to wet or soil yourself.    You have problems urinating (peeing).   You have fevers (a temperature of more than 100.43F or 38C).   Your pain gets worse.

## 2013-06-02 NOTE — ED Follow-up Note (Signed)
Follow-up type: Callback       Routine ED Patient Call Back    Patient contacted by telephone,'' Im doing ok. Thank you for calling.''

## 2013-06-21 ENCOUNTER — Emergency Department
Admission: EM | Admit: 2013-06-21 | Discharge: 2013-06-21 | Disposition: A | Discharge status: Home / Self Care | Payer: Medicaid Other | Attending: Emergency Medicine | Admitting: Emergency Medicine

## 2013-06-21 ENCOUNTER — Emergency Department (HOSPITAL_COMMUNITY): Payer: Medicaid Other

## 2013-06-21 DIAGNOSIS — X500XXA Overexertion from strenuous movement or load, initial encounter: Secondary | ICD-10-CM | POA: Insufficient documentation

## 2013-06-21 DIAGNOSIS — M545 Low back pain, unspecified: Secondary | ICD-10-CM | POA: Insufficient documentation

## 2013-06-21 DIAGNOSIS — G8929 Other chronic pain: Secondary | ICD-10-CM | POA: Insufficient documentation

## 2013-06-21 DIAGNOSIS — I10 Essential (primary) hypertension: Secondary | ICD-10-CM | POA: Insufficient documentation

## 2013-06-21 DIAGNOSIS — Y929 Unspecified place or not applicable: Secondary | ICD-10-CM | POA: Insufficient documentation

## 2013-06-21 LAB — UA, CHEM ONLY POCT
Blood: NEGATIVE
Glucose: NEGATIVE
Specific Gravity: 1.015 (ref 1.002–1.030)
Urobilinogen: 0.2 (ref 0.2–1)

## 2013-06-21 MED ORDER — OXYCODONE-ACETAMINOPHEN 5-325 MG OR TABS
2.0000 | ORAL_TABLET | Freq: Once | ORAL | Status: AC
Start: 2013-06-21 — End: 2013-06-21
  Administered 2013-06-21: 2 via ORAL
  Filled 2013-06-21: qty 2

## 2013-06-21 MED ORDER — DIAZEPAM 2 MG OR TABS
2.0000 mg | ORAL_TABLET | Freq: Once | ORAL | Status: AC
Start: 2013-06-21 — End: 2013-06-21
  Administered 2013-06-21: 2 mg via ORAL
  Filled 2013-06-21: qty 1

## 2013-06-21 MED ORDER — HYDROCODONE-ACETAMINOPHEN 5-325 MG OR TABS
1.0000 | ORAL_TABLET | Freq: Four times a day (QID) | ORAL | Status: DC | PRN
Start: 2013-06-21 — End: 2013-08-15

## 2013-06-21 MED ORDER — HYDROMORPHONE HCL 1 MG/ML IJ SOLN
1.0000 mg | Freq: Once | INTRAMUSCULAR | Status: AC
Start: 2013-06-21 — End: 2013-06-21
  Administered 2013-06-21: 1 mg via INTRAMUSCULAR
  Filled 2013-06-21: qty 1

## 2013-06-21 MED ORDER — LIDOCAINE 5 % EX PTCH
1.0000 | MEDICATED_PATCH | Freq: Once | CUTANEOUS | Status: AC
Start: 2013-06-21 — End: 2013-06-21
  Administered 2013-06-21: 1 via TRANSDERMAL
  Filled 2013-06-21: qty 1

## 2013-06-21 MED ORDER — ONDANSETRON 4 MG OR TBDP
8.0000 mg | ORAL_TABLET | Freq: Once | ORAL | Status: AC
Start: 2013-06-21 — End: 2013-06-21
  Administered 2013-06-21: 8 mg via ORAL
  Filled 2013-06-21: qty 2

## 2013-06-21 NOTE — ED Notes (Signed)
Patient feels better. Ambulating in hall with cane. Request to go home.    Glen Fowler, Glen Deist, MD  06/21/13 2023

## 2013-06-21 NOTE — ED Notes (Signed)
Pt discharged, aox4, pwd, resps even,unlabored, ambulatory with steady gait without using cane, has personal cane with self, taking bus home

## 2013-06-21 NOTE — ED Notes (Signed)
Pt states he urinated for [place].  Urine not found in triage or in fridge.  Pt attempting to urinate again.

## 2013-06-21 NOTE — Discharge Instructions (Signed)
Sciatica    You have been diagnosed with sciatica.    Sciatica is a general term for pain in the low back, hip, or buttocks that travels down the leg. The pain rarely goes below the knees. The pain can be severe and can last for a few days up to a few weeks. A common cause of this type of pain is a pinched nerve from a slipped disk. Sometimes the cause of the pain is never determined.    Treatment includes rest and pain medications. Steroids may be helpful, especially if the cause is a pinched nerve.    YOU SHOULD SEEK MEDICAL ATTENTION IMMEDIATELY, EITHER HERE OR AT THE NEAREST EMERGENCY DEPARTMENT, IF ANY OF THE FOLLOWING OCCURS:   You develop numbness (loss of feeling) or tingling (pins and needles) in your legs.   You develop weakness in your legs that keeps you from standing or walking or makes you stumble or trip over your own feet.   You have trouble controlling your bowels or bladder (you soil or wet yourself).   (MEN) You are unable to have an erection.   Your symptoms become worse.   You have fevers or shaking chills, especially with an increase in back pain.

## 2013-06-21 NOTE — ED Notes (Signed)
Dr. Post at bs

## 2013-06-21 NOTE — ED Notes (Signed)
Pt a/ox3, c/o R low back pain and R leg pain r/t he moved a really heavy tv last night.  PMS intact ble/bue.  NAD. Denies other concerns.

## 2013-06-21 NOTE — ED Provider Notes (Signed)
Emergency Department Provider Note    Patient: Glen Fowler, MRN [id number], DOB 30-Nov-1960  The Date of Service for the Emergency Room encounter is 06/21/2013  5:39 PM   Chief Complaint   Patient presents with   . Hip Pain     pt states was lifting a large tv yesterday and now having lower back/R hip pain. pt also states having R finger tingling.    . Back Pain       HPI: Glen Fowler is a 52 year old male who  has a past medical history of Hypertension; Pure hypercholesterolemia; Sciatic pain; DJD (degenerative joint disease); Osteoarthritis; Vertigo; and Stenosis of carotid artery. Presents to ED with back pain acute onset after lifting heavy TV up stairs. Pain firey on lower R side rad to hip w shoot down leg. Same pain as normal chronic sciatta pain but more intense. Usually pain is controlled w pain meds but sometimes has episodes like this. Tried to get sooner appt with PMD Dr. Riley Lam but soonest is Thurs. Ran out of Norco last night. Took last Percocet this morning with ibuprofen. Requesting pain meds until can see PMD. Denies numbness, tingling, weakness, urinary retention, incontinence of bowel or bladder, abd pain, or other new injuries.   Denies fevers, chills, nausea, vomiting, constipation, diarrhea, abd pain, dysuria, hematuria, melena, hematochezia, shortness of breath, chest pain, headache, or other concerning symptoms.     Patient's medical history has been reviewed today as available in EPIC chart.  Primary MD: Allen Kell  ROS:  12 point review of systems negative other than what is noted in HPI    Home Medications:  Prior to Admission Medications   Outpatient Medications Last Dose Informant Patient Reported? Taking?   HYDROcodone-acetaminophen (NORCO) 10-325 MG per tablet   No No   Sig: Take 1 tablet by mouth every 6 hours as needed for Moderate Pain (Pain Score 4-6).   HYDROcodone-acetaminophen (NORCO) 5-325 MG per tablet   No No   Sig: Take 1 tablet by mouth every 6 hours as needed  for Moderate Pain (Pain Score 4-6).   HYDROcodone-acetaminophen (NORCO) 5-325 MG per tablet   No No   Sig: Take 1 tablet by mouth every 6 hours as needed for Moderate Pain (Pain Score 4-6).   atorvastatin (LIPITOR) 10 MG tablet   Yes No   Sig: Take 10 mg by mouth daily.   carvedilol (COREG) 12.5 MG tablet   Yes No   Sig: Take 12.5 mg by mouth 2 times daily (with meals).   diazepam (VALIUM) 5 MG tablet   No No   Sig: Take 1 tablet by mouth every 6 hours as needed for Anxiety.   ibuprofen (MOTRIN) 600 MG tablet   No No   Sig: Take 1 tablet by mouth every 8 hours as needed for Mild Pain (Pain Score 1-3).   meclizine (ANTIVERT) 25 MG tablet   No No   Sig: Take 1 tablet by mouth 3 times daily as needed for Dizziness.   oxyCODONE-acetaminophen (PERCOCET) 5-325 MG per tablet   Yes No   Sig: Take 1 tablet by mouth every 6 hours as needed for Severe Pain (Pain Score 7-10).   traMADol (ULTRAM) 50 MG tablet   Yes No   Sig: Take 50 mg by mouth every 6 hours as needed for Moderate Pain (Pain Score 4-6).      Facility-Administered Medications: None     Allergies: Morphine; Aspirin; Compazine; and Toradol    Past  Medical History:   Past Medical History   Diagnosis Date   . Hypertension    . Pure hypercholesterolemia    . Sciatic pain    . DJD (degenerative joint disease)    . Osteoarthritis    . Vertigo    . Stenosis of carotid artery      Past Surgical History:   Past Surgical History   Procedure Laterality Date   . Ventral hernia repair       Family History:   Family History   Problem Relation Age of Onset   . Hypertension Mother    . Heart Disease Mother    . Stroke Mother    . Hypertension Father    . Heart Disease Father    . Stroke Father      Social History: Denies IV drug use.  History   Substance Use Topics   . Smoking status: Current Every Day Smoker -- 1.00 packs/day   . Smokeless tobacco: Never Used   . Alcohol Use: No     Physical Exam  Filed Vitals:    06/21/13 1415 06/21/13 1614   BP: 113/53 116/68   Pulse: 68 75      Temp: 97.4 F (36.3 C) 97.6 F (36.4 C)   Resp: 18 18   Height: 6' (1.829 m)    Weight: 102.059 kg (225 lb)    SpO2: 97% 96%     Vital signs reviewed and noted: AVSS  Gen: Patient is in NAD, WDWN, non-toxic appearing  HEENT: NC/AT. PERRL. OP normal. MMM  Neck: Supple. No midline tenderness.   Lungs: Normal breath sounds. No wheeze/rales/rhonchi   Chest: RRR. No murmurs appreciated  Abd: Normal BS. Soft. NT. ND. No r/g. No pulsatile masses.  Back: No midline spinal tenderness. + R sided paraspinal muscle spasm at LS spine. +straight leg raise on R. No saddle anesthesia. Rectal declined.  Extr/MSK: Well perfused, distal pulses intact. No edema.   Skin: Warm, dry.   Neuro: Awake, alert, and oriented. Mentation appropriate. Strength 5/5 throughout bilat UE/LE. Soft touch sensation intact throughout. PT reflexes 2+ bilat. CN II-XII grossly normal.  Psych: Appropriate.     Assessment & Plan  Pt is a 52 year old yo male with history as above who presents with R lumbar back pain. Hx of heavy lifting. Feels like exacerbation of chronic back pain. No urinary sx. No risk factors for SEA. Has follow up this Thurs.  Afeb. VSS. Exam without pulsatile abd mass. Normal neuro exam. +spasms of lumbar paraspinal muscle and + st leg raise.   Likely MSK back pain and sciattica exacerbation. Less likely for spinal injury or pyelo or stones or AAA.    Plan for urine, analgesia, muscle relaxant, monitor and reassess.   Anticipate likely discharge home with pain med refill and PMD f/u.    Patient seen and discussed with ED attending, Tomaszewski, Christian A*.   Patrici Ranks, MD  [facility] Resident           Elmina Hendel, Dolores Lory, MD  Resident  06/21/13 [phone]

## 2013-06-22 NOTE — ED Follow-up Note (Signed)
Follow-up type: Callback       Routine ED Patient Call Back    Patient contacted by telephone," Im doing better. Thank you for calling.''

## 2013-06-22 NOTE — ED Attending Note (Signed)
ED ATTENDING NOTE:    Pt seen with resident. THis is a 52 year old male with htn and chronic low back pain, on chronic opiates but ran out and has increased R sided lumbar pain severe radiating down buttock post lifting TV yesterday. No numbness, no weakness, no diff urinating.  On exam alert nad  BP 136/89  Pulse 81  Temp(Src) 97.8 F (36.6 C)  Resp 16  Ht 6' (1.829 m)  Wt 102.059 kg (225 lb)  BMI 30.51 kg/m2  SpO2 97%  Nl VS  ABD soft nontender, no pulsatile mass, femorals +2 bilat  BACK mild r paraspinal lumbar tenderness, none midline  LE good strength and sensation nl gait  IMP low back pain  CDM suspect sciatica not cauda equina, not aneurysm of abd aorta  PLN pain meds, early f/u

## 2013-08-15 ENCOUNTER — Emergency Department
Admission: EM | Admit: 2013-08-15 | Discharge: 2013-08-15 | Discharge status: Against Medical Advice | Payer: Medicaid Other | Attending: Emergency Medicine | Admitting: Emergency Medicine

## 2013-08-15 DIAGNOSIS — M549 Dorsalgia, unspecified: Secondary | ICD-10-CM | POA: Insufficient documentation

## 2013-08-15 NOTE — ED Attending Note (Signed)
ED ATTENDING NOTE:    Called for patient. Not in [facility], ambulance or ED.  Reviewed xrays , no acute interventional finding.   Safe for discharge. Will declare LWBS.  X-ray Lumbosacral Spine 2 Or 3 Views    08/15/2013    EXAM DESCRIPTION: LUMBOSACRAL SPINE 2 OR 3 VIEWS  CLINICAL HISTORY: Low back pain after twisting injury . History of chronic low back pain.  COMPARISON: Lumbar spine radiographs 05/19/2013 and 12/30/2012. CT dated 12/16/2012.  TECHNIQUE: 3 image(s) of the lumbar spine.  FINDINGS: There are five non-rib bearing lumbar vertebral bodies. There is no evidence  for acute fracture or subluxation. Chronic pars defects of L5 are better  visualized on the previous CT of the lumbar spine. There is no associated  spondylolisthesis. The vertebral body heights, disc spaces and spinal  alignment are maintained.  Sacroiliac joints, hip joints and pubic symphysis are congruent.  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  IMPRESSION: No acute osseous abnormalities.  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

## 2013-08-17 NOTE — ED Provider Notes (Signed)
History  Chief Complaint   Patient presents with   . Back Pain     [facility]. pt states he slipped and caught himself on the wall stoping his fall. pt states he "twisted his back " Now c/o lumbar back pain. pt has Hx of chronic back pain.     HPI  LWBS    Past Medical History   Diagnosis Date   . Hypertension    . Pure hypercholesterolemia    . Sciatic pain    . DJD (degenerative joint disease)    . Osteoarthritis    . Vertigo    . Stenosis of carotid artery        Past Surgical History   Procedure Laterality Date   . Ventral hernia repair         Family History   Problem Relation Age of Onset   . Hypertension Mother    . Heart Disease Mother    . Stroke Mother    . Hypertension Father    . Heart Disease Father    . Stroke Father        History   Substance Use Topics   . Smoking status: Current Every Day Smoker -- 1.00 packs/day   . Smokeless tobacco: Never Used   . Alcohol Use: No       Review of Systems    Physical Exam  BP 126/74  Pulse 78  Temp(Src) 98.3 F (36.8 C)  Resp 18  Ht 6' (1.829 m)  Wt 104.327 kg (230 lb)  BMI 31.19 kg/m2  SpO2 97%    Physical Exam    ED Course/Medical Decision Making Narrative            Critical Care Time            Additional Notes      Home Medication List  Prior to Admission Medications   Outpatient Medications Last Dose Informant Patient Reported? Taking?   HYDROcodone-acetaminophen (NORCO) 10-325 MG per tablet   No No   Sig: Take 1 tablet by mouth every 6 hours as needed for Moderate Pain (Pain Score 4-6).   HYDROcodone-acetaminophen (NORCO) 5-325 MG per tablet   No No   Sig: Take 1 tablet by mouth every 6 hours as needed for Moderate Pain (Pain Score 4-6).   HYDROcodone-acetaminophen (NORCO) 5-325 MG per tablet   No No   Sig: Take 1 tablet by mouth every 6 hours as needed for Moderate Pain (Pain Score 4-6).   HYDROcodone-acetaminophen (NORCO) 5-325 MG tablet   No No   Sig: Take 1 tablet by mouth every 6 hours as needed for Moderate Pain (Pain Score  4-6).   atorvastatin (LIPITOR) 10 MG tablet   Yes No   Sig: Take 10 mg by mouth daily.   carvedilol (COREG) 12.5 MG tablet   Yes No   Sig: Take 12.5 mg by mouth 2 times daily (with meals).   diazepam (VALIUM) 5 MG tablet   No No   Sig: Take 1 tablet by mouth every 6 hours as needed for Anxiety.   ibuprofen (MOTRIN) 600 MG tablet   No No   Sig: Take 1 tablet by mouth every 8 hours as needed for Mild Pain (Pain Score 1-3).   meclizine (ANTIVERT) 25 MG tablet   No No   Sig: Take 1 tablet by mouth 3 times daily as needed for Dizziness.   oxyCODONE-acetaminophen (PERCOCET) 5-325 MG per tablet   Yes No  Sig: Take 1 tablet by mouth every 6 hours as needed for Severe Pain (Pain Score 7-10).   traMADol (ULTRAM) 50 MG tablet   Yes No   Sig: Take 50 mg by mouth every 6 hours as needed for Moderate Pain (Pain Score 4-6).      Facility-Administered Medications: None       Decker Cogdell, Samara Deist, MD  08/17/13 [phone]

## 2013-10-03 ENCOUNTER — Encounter (INDEPENDENT_AMBULATORY_CARE_PROVIDER_SITE_OTHER): Payer: Self-pay | Admitting: Otolaryngology

## 2013-10-03 ENCOUNTER — Ambulatory Visit (INDEPENDENT_AMBULATORY_CARE_PROVIDER_SITE_OTHER): Payer: Medicaid Other | Admitting: Otolaryngology

## 2013-10-03 VITALS — BP 139/77 | HR 55 | Temp 97.9°F

## 2013-10-03 DIAGNOSIS — R2689 Other abnormalities of gait and mobility: Secondary | ICD-10-CM

## 2013-10-03 DIAGNOSIS — R42 Dizziness and giddiness: Principal | ICD-10-CM

## 2013-10-03 MED ORDER — HYDROCHLOROTHIAZIDE 25 MG OR TABS
12.5000 mg | ORAL_TABLET | Freq: Two times a day (BID) | ORAL | Status: AC
Start: 2013-10-03 — End: ?

## 2013-10-03 NOTE — Progress Notes (Signed)
Recurrent vertigo, imbalance. Vertigo provoked by sitting up in bed or bending over. Intermittent dizziness episodes. New onset ringing AS, associated with vertigo. Occasional aural pressure AS, no subjective hearing loss. No otalgia.    Ear history: no infections surgery. No cold sores. 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    No recent audiogram    Medical History: Cardiomyopathy, hip replacements.     O/E TMs normal AC>BC Weber C  Cranial nerves, motor, sensory normal   CBM, RAM, FTN normal  Hallpike. LED RED negative, ++ instability sitting up.  CBM, RAM, FTN   Head Thrust normal    Assess:   ? AS Meniere's Syndrome    Plan:   HCTZ 12. 5 mg BID

## 2013-10-11 ENCOUNTER — Telehealth (INDEPENDENT_AMBULATORY_CARE_PROVIDER_SITE_OTHER): Payer: Self-pay | Admitting: Otolaryngology

## 2013-10-11 NOTE — Telephone Encounter (Signed)
Patient states he has been vomiting every single time he takes his medicaiton: hydrochlorothiazide (HYDRODIURIL) 25 MG tablet. Patient says he did not stop taking this medicaiton because patient wasn't sure if he should stop or not. Patient says he's been taking other medicaiton for years and never had this feeling before. Patient states he's getting annoyed from this issue. Please give patient a call.

## 2013-10-15 NOTE — Telephone Encounter (Signed)
Called previously and left message to discontinue medication.  Called again, now answer

## 2013-11-04 ENCOUNTER — Encounter (INDEPENDENT_AMBULATORY_CARE_PROVIDER_SITE_OTHER): Payer: Medicaid Other | Admitting: Otolaryngology

## 2013-11-18 ENCOUNTER — Encounter (INDEPENDENT_AMBULATORY_CARE_PROVIDER_SITE_OTHER): Payer: Medicaid Other | Admitting: Otolaryngology

## 2013-11-21 ENCOUNTER — Telehealth (INDEPENDENT_AMBULATORY_CARE_PROVIDER_SITE_OTHER): Payer: Self-pay | Admitting: Otolaryngology

## 2013-11-21 DIAGNOSIS — H8109 Meniere's disease, unspecified ear: Principal | ICD-10-CM

## 2013-11-21 NOTE — Telephone Encounter (Signed)
What is the reason for call: Extreme Vertigo    How long have symptoms been present:  Last September 2014    Best Contact Number: [phone]    This message will be transmitted to our triage nurse; you can expect a call by the end of the working day.

## 2013-11-22 MED ORDER — SPIRONOLACTONE 25 MG OR TABS
25.00 mg | ORAL_TABLET | Freq: Two times a day (BID) | ORAL | Status: AC
Start: 2013-11-22 — End: ?

## 2013-11-22 NOTE — Telephone Encounter (Signed)
Called, discussed.    Vertigo with head movements.     Discontinued HCTZ, due to nausea and vomiting.    Assess:    ? Benign Positional Vertigo.   Endolymphatic Hydrops    Plan:   Spironolactone 25 BID  F/u in clinic

## 2013-11-22 NOTE — Telephone Encounter (Signed)
Emailed Dr. Wynona LunaViirre regarding encounter. Requested he contact pt.

## 2013-11-22 NOTE — Telephone Encounter (Signed)
LVM for pt to call and schedule  °

## 2013-11-22 NOTE — Telephone Encounter (Signed)
Pt is calling again regarding symptoms. Pt has extreme vertigo and has fluid blockage in both ears. Pt would like recommendation for another medication because he got sick when he was taking rx HYDRODIURIL. Please contact at [phone] and advise.    Pt is extremely dizzy he can't wake and is going to fall down every time he tries.

## 2014-01-13 ENCOUNTER — Encounter (INDEPENDENT_AMBULATORY_CARE_PROVIDER_SITE_OTHER): Payer: Medicaid Other | Admitting: Otolaryngology

## 2014-01-27 ENCOUNTER — Telehealth (INDEPENDENT_AMBULATORY_CARE_PROVIDER_SITE_OTHER): Payer: Self-pay | Admitting: Otolaryngology

## 2014-01-27 NOTE — Telephone Encounter (Signed)
Patient called in stating - " Is there anyway Dr can see me sooner? My Vertigo is so bad that i need people to walk around w/ me when i walk. I can't work due to the Vertigo. I can't do anything. I'm walking into doors."     Please advise   Petr @ [phone]

## 2014-01-30 NOTE — Telephone Encounter (Signed)
Will try to overbook.

## 2014-02-17 ENCOUNTER — Encounter (INDEPENDENT_AMBULATORY_CARE_PROVIDER_SITE_OTHER): Payer: Medicaid Other | Admitting: Otolaryngology

## 2014-03-13 ENCOUNTER — Encounter (INDEPENDENT_AMBULATORY_CARE_PROVIDER_SITE_OTHER): Payer: Medicaid Other | Admitting: Otolaryngology

## 2014-03-17 ENCOUNTER — Telehealth (INDEPENDENT_AMBULATORY_CARE_PROVIDER_SITE_OTHER): Payer: Self-pay | Admitting: Otolaryngology

## 2014-03-17 DIAGNOSIS — G43109 Migraine with aura, not intractable, without status migrainosus: Principal | ICD-10-CM

## 2014-03-17 MED ORDER — VERAPAMIL HCL SR (BID) 180 MG OR TBCR
180.00 mg | EXTENDED_RELEASE_TABLET | Freq: Every day | ORAL | Status: AC
Start: 2014-03-17 — End: ?

## 2014-03-17 NOTE — Telephone Encounter (Signed)
Patient called in stating " I have been calling and leaving messages for a week now with no response. I have vertigo and it is getting worse. I can't get to the bus by myself because i can't walk on my own. The medication Dr Ulis RiasViiree has me on is not working. Somebody needs to do something because i'm about tired of this. Thank You."     Please advise   Molly MaduroRobert @ [phone]

## 2014-03-17 NOTE — Telephone Encounter (Signed)
Dizziness getting out of bed. Instability while walking. Has completed course of spirolactone. AD ringing intermittent, increases with ringing.   No subjective hearing change.   Headaches. History of migraine.    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

## 2014-04-03 ENCOUNTER — Telehealth (INDEPENDENT_AMBULATORY_CARE_PROVIDER_SITE_OTHER): Payer: Self-pay | Admitting: Otolaryngology

## 2014-04-03 NOTE — Telephone Encounter (Signed)
Pt said this needs to be sent "as soon as possible."  Pt requesting a call back when letter has been faxed.

## 2014-04-03 NOTE — Telephone Encounter (Signed)
Letter written

## 2014-04-03 NOTE — Telephone Encounter (Signed)
Pt requesting MD write a letter that will put him out of work for another 6 months until he gets his vertigo under control. Pt says he needs this letter in order to get paid/benefits and avoid being homeless. Please advise and fax letter to pt's case worker Mr. Warren Lacyscobar at   [phone]

## 2014-04-04 NOTE — Telephone Encounter (Signed)
Letter faxed today to [phone].

## 2014-04-05 ENCOUNTER — Telehealth (INDEPENDENT_AMBULATORY_CARE_PROVIDER_SITE_OTHER): Payer: Self-pay | Admitting: Otolaryngology

## 2014-04-05 DIAGNOSIS — R110 Nausea: Principal | ICD-10-CM

## 2014-04-05 NOTE — Telephone Encounter (Signed)
Pt informing MD that the medication has been helping his vertigo but he has been feeling nauseous. Pt requesting MD order him Rx ZOFRAN to help the nausea. Please advise     [facility] -- [address] -- [place] [id number] -- [phone] -- [phone]

## 2014-04-06 MED ORDER — ONDANSETRON 8 MG OR TBDP
8.00 mg | ORAL_TABLET | Freq: Three times a day (TID) | ORAL | Status: AC | PRN
Start: 2014-04-06 — End: ?

## 2014-04-06 NOTE — Telephone Encounter (Signed)
Zofran ordered.

## 2014-04-21 ENCOUNTER — Encounter (INDEPENDENT_AMBULATORY_CARE_PROVIDER_SITE_OTHER): Payer: Medicaid Other | Admitting: Otolaryngology

## 2014-04-24 ENCOUNTER — Encounter (INDEPENDENT_AMBULATORY_CARE_PROVIDER_SITE_OTHER): Payer: Medicaid Other | Admitting: Otolaryngology

## 2014-05-15 ENCOUNTER — Encounter (INDEPENDENT_AMBULATORY_CARE_PROVIDER_SITE_OTHER): Payer: Medicaid Other | Admitting: Otolaryngology

## 2014-06-16 ENCOUNTER — Ambulatory Visit (HOSPITAL_BASED_OUTPATIENT_CLINIC_OR_DEPARTMENT_OTHER): Payer: Medicaid Other | Admitting: Otolaryngology

## 2014-06-16 ENCOUNTER — Telehealth (HOSPITAL_BASED_OUTPATIENT_CLINIC_OR_DEPARTMENT_OTHER): Payer: Self-pay | Admitting: Otolaryngology

## 2014-06-16 NOTE — Telephone Encounter (Signed)
May overbook

## 2014-06-16 NOTE — Telephone Encounter (Signed)
Will route to Dr. Wynona LunaViirre

## 2014-06-16 NOTE — Telephone Encounter (Signed)
"   I had an appt today with Dr Wynona LunaViirre that i missed due to the rain. I have enough difficulty driving with this Vertigo. I didn't want to drive in the rain. Can Dr Wynona LunaViirre see me any sooner that 06/30/2014?"     Please advise

## 2014-06-30 ENCOUNTER — Ambulatory Visit (HOSPITAL_BASED_OUTPATIENT_CLINIC_OR_DEPARTMENT_OTHER): Payer: Medicaid Other | Admitting: Otolaryngology

## 2014-08-04 ENCOUNTER — Ambulatory Visit (HOSPITAL_BASED_OUTPATIENT_CLINIC_OR_DEPARTMENT_OTHER): Payer: Medicaid Other | Admitting: Otolaryngology

## 2014-09-29 ENCOUNTER — Ambulatory Visit (HOSPITAL_BASED_OUTPATIENT_CLINIC_OR_DEPARTMENT_OTHER): Payer: Medicaid Other | Admitting: Otolaryngology

## 2014-10-02 ENCOUNTER — Encounter (INDEPENDENT_AMBULATORY_CARE_PROVIDER_SITE_OTHER): Payer: Medicaid Other | Admitting: Otolaryngology

## 2014-10-18 ENCOUNTER — Emergency Department
Admission: EM | Admit: 2014-10-18 | Discharge: 2014-10-18 | Payer: Medicaid Other | Attending: Emergency Medicine | Admitting: Emergency Medicine

## 2014-10-18 ENCOUNTER — Other Ambulatory Visit (HOSPITAL_BASED_OUTPATIENT_CLINIC_OR_DEPARTMENT_OTHER): Payer: Self-pay

## 2014-10-18 NOTE — ED Notes (Signed)
Pt stood up out of wheelchair and yelled " I'm out of here, I'm not waiting 6 hours for my foot" I explained to pt we are happy to see him and get x-rays started while he waits. Pt walked to exit with steady gait yelling "Kiss my ass" RN aware

## 2014-10-26 ENCOUNTER — Encounter (INDEPENDENT_AMBULATORY_CARE_PROVIDER_SITE_OTHER): Payer: Medicaid Other | Admitting: Otolaryngology

## 2014-11-10 ENCOUNTER — Encounter (INDEPENDENT_AMBULATORY_CARE_PROVIDER_SITE_OTHER): Payer: Medicaid Other | Admitting: Otolaryngology

## 2014-12-04 ENCOUNTER — Encounter (INDEPENDENT_AMBULATORY_CARE_PROVIDER_SITE_OTHER): Payer: Self-pay | Admitting: Otolaryngology

## 2014-12-05 ENCOUNTER — Encounter (INDEPENDENT_AMBULATORY_CARE_PROVIDER_SITE_OTHER): Payer: Self-pay | Admitting: Otolaryngology

## 2015-03-02 ENCOUNTER — Encounter (INDEPENDENT_AMBULATORY_CARE_PROVIDER_SITE_OTHER): Payer: Self-pay | Admitting: Otolaryngology

## 2024-02-04 ENCOUNTER — Other Ambulatory Visit: Payer: Self-pay

## 2024-02-04 ENCOUNTER — Emergency Department
Admission: EM | Admit: 2024-02-04 | Discharge: 2024-02-04 | Disposition: A | Discharge status: Home / Self Care | Payer: MEDICAID

## 2024-02-04 ENCOUNTER — Emergency Department (HOSPITAL_BASED_OUTPATIENT_CLINIC_OR_DEPARTMENT_OTHER): Payer: MEDICAID

## 2024-02-04 DIAGNOSIS — X509XXA Other and unspecified overexertion or strenuous movements or postures, initial encounter: Secondary | ICD-10-CM | POA: Insufficient documentation

## 2024-02-04 DIAGNOSIS — M25511 Pain in right shoulder: Secondary | ICD-10-CM | POA: Insufficient documentation

## 2024-02-04 DIAGNOSIS — S4991XA Unspecified injury of right shoulder and upper arm, initial encounter: Secondary | ICD-10-CM | POA: Insufficient documentation

## 2024-02-04 MED ORDER — ONDANSETRON 4 MG OR TBDP
4.0000 mg | ORAL_TABLET | Freq: Once | ORAL | Status: AC
Start: 2024-02-04 — End: 2024-02-04
  Administered 2024-02-04: 4 mg via ORAL
  Filled 2024-02-04: qty 1

## 2024-02-04 MED ORDER — OXYCODONE HCL 5 MG OR TABS
5.0000 mg | ORAL_TABLET | Freq: Four times a day (QID) | ORAL | 0 refills | Status: AC | PRN
Start: 2024-02-04 — End: ?

## 2024-02-04 MED ORDER — OXYCODONE-ACETAMINOPHEN 5-325 MG OR TABS
2.0000 | ORAL_TABLET | Freq: Once | ORAL | Status: AC
Start: 2024-02-04 — End: 2024-02-04
  Administered 2024-02-04: 2 via ORAL
  Filled 2024-02-04: qty 2

## 2024-02-04 MED ORDER — OXYCODONE HCL 5 MG OR TABS
5.0000 mg | ORAL_TABLET | Freq: Four times a day (QID) | ORAL | 0 refills | Status: DC | PRN
Start: 2024-02-04 — End: 2024-02-04
  Filled 2024-02-04: qty 3, 1d supply, fill #0

## 2024-02-04 NOTE — ED Notes (Signed)
 X-ray at bedside

## 2024-02-04 NOTE — EMS Narrative (Addendum)
 EMS note for [id number] by M11, last updated at 03:01 on 2024-02-04  M11, [facility]     1899-09-01     62 Years     90.7   kg                      Patient's symptom(s):  M79.6 - Pain in right arm                      Paramedic's impression(s):  T14.90 - Injury, unspecified, initial encounter                      M11 AOS to a private residence to find a 44m chief complaint of right   shoulder pain. Pt states he had gotten a new air conditioning unit today   and was unboxing it. While he was unboxing it, it was stuck and he yanked   on it to get it out. When he yanked he felt a tearing sensation in his   right shoulder. Pt states the pain originally wasn't bad but while time   continued the pain became unbearable and he activated 911.  Upon arrival pt   was ambulating out of his home with no difficulties, alert and tracking,   holding his right arm. A/T/Ox4 MAEx4 skins PWD breathing full and effective   with clear lung sounds. Pt has pain isolated to the entirety of his right   shoulder, described as a sharp sensation, pt has found no relief. Pt has   pms intact x 4 and has full range of motion with pain. Pt had two episodes   of vomiting due to the pain and states all normal content.  No obvious   deformities noted to the shoulder. No head neck or back pain, no loc, no   blood thinning medications. No complaints of sob, chest pain, abdominal   pain, or recent sickness. ETCO2 monitored and within normal limits. Nothing   noted on a secondary. Monitored and assessed vitals on scene and enroute   to hospital. Pt had poor vascular access therefore was given 50mcg of   fentanyl  IN. 4mg  of zofran . After treatment pain decreased and nausea   decreased. Pts arm placed in a sling. Position of comfort, semi fowlers.   Comfort care.  Code 20 to [place] per pt request. Pt ambulated to gurney with   no difficulty.  Secured to gurney with all safety belts. Pt care turned   over to hospital staff.                        Patient's Known Allergies:  [id number] -   7052 - Morphine   [id number] - No known allergies                      Paramedic Physical Exam:  Eye (Left) Assessment: PERRL, 3-mm  Eye (Right) Assessment: PERRL, 3-mm  Mental Status Assessment: Normal Baseline for Patient  Neurological Assessment: Normal Baseline for Patient                      Paramedic Medications:  00:55 - Fentanyl  - Intranasal 50 Micrograms per Kilogram (mcg/kg)  00:53 - Ondansetron  - Intramuscular (IM) 4 Milligrams (mg)                      Paramedic Procedures:  00:50 - 3-Lead  ECG Monitored: Yes                      EMS Response Details:  EMS responded at 00:35  EMS arrived at 00:40

## 2024-02-04 NOTE — Discharge Instructions (Signed)
 You were Seen and evaluated in the emergency department today for right shoulder pain.  You likely have a rotator cuff injury.  Please follow up with sports medicine.  If you develop any new or concerning symptoms then you should return to the emergency department immediately.  Thank you for choosing [facility].

## 2024-02-04 NOTE — ED Provider Notes (Signed)
 ED Provider Note  [place] electronic medical record reviewed for pertinent medical history.     [facility] DOB: Apr 17, 1961 PMD: No Pcp, Per Patient     ED Arrival Information       Expected   -    Arrival   02/04/2024 01:11    Acuity   Urgent/ESI Level 3              Means of arrival   Paramedic Unit    Escorted by   -    Service   ED Medicine    Admission type   Emergency              Arrival complaint   r shoulder pain             Chief Complaint   Patient presents with    Shoulder Pain     Pt was moving a box today and junked his R shoulder. Denies PMH, PMS intact.       HPI: Rigby Leonhardt is a 63 year old male who has a past medical history of DJD (degenerative joint disease), Hypertension, Osteoarthritis, Pure hypercholesterolemia, Sciatic pain, Stenosis of carotid artery, and Vertigo.  Patient presents with right shoulder pain after attempting to move an [facility] unit out of a box approximately 2-3 hours ago endorsing mild nausea.  Patient notes that he was pulling hard on an [facility] unit to get it out of its Box stayed tear in his right shoulder with immediate pain mild numbness to palmar surface of his right 4th and 5th digits.  He was initially able to raise his arm without issue but 1 hour later became increasingly painful to move his right arm.  He notes that he can range his arm by using his left arm to lift right arm.  Per medics, 50 mcg fentanyl  4 mg Zofran  given in route.  Patient denies any chest pain, shortness of breath, recent illnesses, abdominal pain, vomiting.        Pertinent Medical History:    PMHx: As above    Past Surgical History:   Procedure Laterality Date    ventral hernia repair         Family History   Problem Relation Name Age of Onset    Hypertension Mother ##Mo1     Heart Disease Mother ##Mo1     Stroke Mother ##Mo1     Hypertension Father ##Fa1     Heart Disease Father ##Fa1     Stroke Father ##Fa1        Current Outpatient Medications   Medication Instructions    hydroCHLOROthiazide   (HYDRODIURIL ) 12.5 mg, Oral, 2 TIMES DAILY    ibuprofen  (MOTRIN ) 600 mg, Oral, EVERY 8 HOURS PRN    meclizine  (ANTIVERT ) 25 mg, Oral, 3 TIMES DAILY PRN    ondansetron  (ZOFRAN  ODT) 8 mg, Oral, EVERY 8 HOURS PRN    oxyCODONE  (ROXICODONE ) 5 mg, Oral, EVERY 6 HOURS PRN    spironolactone  (ALDACTONE ) 25 mg, Oral, 2 TIMES DAILY    verapamil  (CALAN  SR) 180 mg, Oral, DAILY       Physical Exam  BP 134/75   Pulse 67   Temp 98.1 F (36.7 C)   Resp 18   SpO2 99%   Physical Exam  Constitutional:       General: He is not in acute distress.     Appearance: He is not ill-appearing.   HENT:      Head: Normocephalic and atraumatic.  Right Ear: External ear normal.      Left Ear: External ear normal.      Nose: Nose normal.      Mouth/Throat:      Mouth: Mucous membranes are moist.      Pharynx: Oropharynx is clear.   Eyes:      General: No scleral icterus.        Right eye: No discharge.         Left eye: No discharge.      Extraocular Movements: Extraocular movements intact.   Cardiovascular:      Rate and Rhythm: Normal rate.   Pulmonary:      Effort: Pulmonary effort is normal. No respiratory distress.   Abdominal:      General: Abdomen is flat.      Palpations: Abdomen is soft.      Tenderness: There is no abdominal tenderness.   Musculoskeletal:         General: Normal range of motion.      Cervical back: Normal range of motion and neck supple.      Right lower leg: No edema.      Left lower leg: No edema.      Comments: Tenderness to right anterior and lateral shoulder, full range of motion with increasing pain and ranging shoulder above 90.  No obvious skin breakdown, erythema or soft tissue swelling.   Skin:     General: Skin is warm and dry.   Neurological:      General: No focal deficit present.      Mental Status: He is alert and oriented to person, place, and time. Mental status is at baseline.      Comments: mild decreased sensation in ulnar nerve distribution on right palmar surface of 4th and 5th digits,  otherwise no focal neurological deficits.  No numbness to axillary nerve distribution around right anterior, posterior or lateral shoulder.   Psychiatric:         Mood and Affect: Mood normal.           Orders/Medications    Orders Placed This Encounter   Procedures    CURES Review Documentation - I Reviewed CURES    X-Ray Shoulder Complete Min 2 Views - Right    Sports Medicine Clinic (Family Medicine)       Medications   oxyCODONE -acetaminophen  (PERCOCET) 5-325 MG tablet 2 tablet (2 tablets Oral Given 02/04/24 0254)   ondansetron  (ZOFRAN  ODT) disintegrating tablet 4 mg (4 mg Oral Given 02/04/24 0254)           Medical Decision Making/Assessment/Plan    This is a(n) 63 year old male who has a past medical history of DJD (degenerative joint disease), Hypertension, Osteoarthritis, Pure hypercholesterolemia, Sciatic pain, Stenosis of carotid artery, and Vertigo. Patient presents with right shoulder pain after attempting to move an [facility] unit out of a box approximately 2-3 hours ago endorsing mild nausea. Vitals were hemodynamically stable and afebrile, patient is satting well on room air.  On exam, tenderness to right anterior and lateral shoulder, full range of motion with increasing pain and ranging shoulder above 90.  No obvious skin breakdown, erythema or soft tissue swelling, mild decreased sensation in ulnar nerve distribution on right palmar surface of 4th and 5th digits, otherwise no focal neurological deficits.  No numbness to axillary nerve distribution around right anterior, posterior or lateral shoulder.  Exam was otherwise unremarkable.     Patient's initial presentation is most concerning for possible rotator  cuff injury to right shoulder.  Lower concern for acute fracture/dislocation given patient's range of motion though with pain.  We will follow up plain film of right shoulder, control pain and likely discharge with outpatient sports medicine follow up for further evaluation/imaging.  This plan was  discussed with the patient and he is amenable.    ED Course/Updates/Disposition  ED Course as of 02/04/24 0729   Others' Documentation   Thu Feb 04, 2024   0329 I have seen and evaluated this patient in combination with the resident physician.  Briefly, patient is a 63 year old male with no past medical history who presenting to the ED with a right shoulder pain.  Patient was lifting an AC unit out of a box when he felt a twinge in his right shoulder.  Patient has active range of motion of his right upper extremity to about 15 and then is able to lift the remainder of the shoulder passively.  He has 2+ radial pulses and normal cap refill.  He has no sensory deficit on my exam. Suspect rotator cuff injury.  X-ray confirms no fracture or dislocation.  We will place in his sling and provide sports medicine follow up.  A few doses of oxycodone  provided at discharge for pain.  Patient is agreeable to the discharge plan of care.  Shared decision-making at time of discharge home. [LS]      ED Course User Index  [LS] Kriste Tinnie HERO, DO         ED Clinical Impression as of 02/04/24 0729   Injury of right shoulder, initial encounter        Attending Attestation:    I personally interviewed and examined the patient during the ED visit beginning 02/04/2024 at 0231 hours, and I have reviewed the note by Gillermo Signe Lenis, MD for the ED visit beginning 02/04/2024 at 0231 hours. I agree with the Resident history, exam, assessment, and plan.                     Data Reviewed:      Limited Bedside Ultrasound Procedure:   All images archived on [facility] servers.        Risk of Complications and/or Morbidity:      1. Injury of right shoulder, initial encounter  Sports Medicine Clinic (Family Medicine)    oxyCODONE  (ROXICODONE ) 5 MG immediate release tablet    DISCONTINUED: oxyCODONE  (ROXICODONE ) 5 MG immediate release tablet               Tinnie HERO Kriste, DO            Gillermo, Signe Lenis, MD  Resident  02/04/24 0729        Kriste Tinnie HERO, DO  02/05/24 2246
